# Patient Record
Sex: Female | Born: 1955 | Race: Black or African American | Hispanic: No | Smoking: Former smoker
Health system: Southern US, Community
[De-identification: ages and names within clinical notes are randomized; demographics above are authoritative.]

## PROBLEM LIST (undated history)

## (undated) ENCOUNTER — Encounter

## (undated) ENCOUNTER — Telehealth

## (undated) ENCOUNTER — Encounter: Attending: Internal Medicine | Primary: Internal Medicine

## (undated) ENCOUNTER — Ambulatory Visit: Payer: MEDICARE

## (undated) ENCOUNTER — Telehealth: Attending: Internal Medicine | Primary: Internal Medicine

## (undated) ENCOUNTER — Ambulatory Visit

## (undated) ENCOUNTER — Encounter: Payer: MEDICARE | Attending: Internal Medicine | Primary: Internal Medicine

## (undated) ENCOUNTER — Ambulatory Visit: Payer: Medicare (Managed Care)

## (undated) ENCOUNTER — Telehealth: Attending: Federally Qualified Health Center (FQHC) | Primary: Federally Qualified Health Center (FQHC)

## (undated) ENCOUNTER — Ambulatory Visit: Payer: MEDICARE | Attending: Internal Medicine | Primary: Internal Medicine

## (undated) ENCOUNTER — Telehealth: Attending: Surgery | Primary: Surgery

## (undated) ENCOUNTER — Telehealth
Attending: Pharmacist Clinician (PhC)/ Clinical Pharmacy Specialist | Primary: Pharmacist Clinician (PhC)/ Clinical Pharmacy Specialist

## (undated) DIAGNOSIS — D494 Neoplasm of unspecified behavior of bladder: Secondary | ICD-10-CM

## (undated) DIAGNOSIS — Z5189 Encounter for other specified aftercare: Secondary | ICD-10-CM

## (undated) DIAGNOSIS — N3289 Other specified disorders of bladder: Secondary | ICD-10-CM

## (undated) DIAGNOSIS — I10 Essential (primary) hypertension: Secondary | ICD-10-CM

## (undated) DIAGNOSIS — R310 Gross hematuria: Secondary | ICD-10-CM

## (undated) DIAGNOSIS — Z923 Personal history of irradiation: Secondary | ICD-10-CM

## (undated) DIAGNOSIS — Z973 Presence of spectacles and contact lenses: Secondary | ICD-10-CM

## (undated) DIAGNOSIS — Z87411 Personal history of vaginal dysplasia: Secondary | ICD-10-CM

## (undated) DIAGNOSIS — Z86018 Personal history of other benign neoplasm: Secondary | ICD-10-CM

## (undated) HISTORY — PX: COLPOSCOPY: SHX161

---
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
## 1997-05-21 ENCOUNTER — Other Ambulatory Visit: Admission: RE | Admit: 1997-05-21 | Discharge: 1997-05-21 | Payer: Self-pay | Admitting: Obstetrics & Gynecology

## 1997-11-02 ENCOUNTER — Inpatient Hospital Stay (HOSPITAL_COMMUNITY): Admission: RE | Admit: 1997-11-02 | Discharge: 1997-11-04 | Payer: Self-pay | Admitting: Obstetrics & Gynecology

## 1998-04-03 HISTORY — PX: ABDOMINAL HYSTERECTOMY: SHX81

## 2001-04-08 ENCOUNTER — Other Ambulatory Visit: Admission: RE | Admit: 2001-04-08 | Discharge: 2001-04-08 | Payer: Self-pay | Admitting: *Deleted

## 2001-07-10 ENCOUNTER — Ambulatory Visit (HOSPITAL_BASED_OUTPATIENT_CLINIC_OR_DEPARTMENT_OTHER): Admission: RE | Admit: 2001-07-10 | Discharge: 2001-07-10 | Payer: Self-pay

## 2001-08-14 ENCOUNTER — Ambulatory Visit (HOSPITAL_COMMUNITY): Admission: RE | Admit: 2001-08-14 | Discharge: 2001-08-14 | Payer: Self-pay | Admitting: Family Medicine

## 2001-08-14 ENCOUNTER — Encounter: Payer: Self-pay | Admitting: Family Medicine

## 2008-05-04 ENCOUNTER — Ambulatory Visit: Payer: Self-pay | Admitting: Gynecologic Oncology

## 2008-05-12 ENCOUNTER — Ambulatory Visit: Payer: Self-pay | Admitting: Gynecologic Oncology

## 2008-06-01 ENCOUNTER — Ambulatory Visit: Payer: Self-pay | Admitting: Gynecologic Oncology

## 2008-06-08 ENCOUNTER — Ambulatory Visit: Payer: Self-pay | Admitting: Gynecologic Oncology

## 2008-11-01 ENCOUNTER — Ambulatory Visit: Payer: Self-pay | Admitting: Gynecologic Oncology

## 2008-11-09 ENCOUNTER — Ambulatory Visit: Payer: Self-pay | Admitting: Gynecologic Oncology

## 2009-04-03 ENCOUNTER — Ambulatory Visit: Payer: Self-pay | Admitting: Gynecologic Oncology

## 2009-04-27 ENCOUNTER — Ambulatory Visit: Payer: Self-pay | Admitting: Gynecologic Oncology

## 2009-05-04 ENCOUNTER — Ambulatory Visit: Payer: Self-pay | Admitting: Gynecologic Oncology

## 2009-05-11 ENCOUNTER — Ambulatory Visit: Payer: Self-pay | Admitting: Gynecologic Oncology

## 2009-06-01 ENCOUNTER — Ambulatory Visit: Payer: Self-pay | Admitting: Gynecologic Oncology

## 2009-06-08 ENCOUNTER — Ambulatory Visit: Payer: Self-pay | Admitting: Gynecologic Oncology

## 2009-07-02 ENCOUNTER — Ambulatory Visit: Payer: Self-pay | Admitting: Gynecologic Oncology

## 2009-07-27 ENCOUNTER — Ambulatory Visit: Payer: Self-pay | Admitting: Gynecologic Oncology

## 2009-08-01 ENCOUNTER — Ambulatory Visit: Payer: Self-pay | Admitting: Gynecologic Oncology

## 2009-08-10 ENCOUNTER — Ambulatory Visit: Payer: Self-pay | Admitting: Gynecologic Oncology

## 2009-09-01 ENCOUNTER — Ambulatory Visit: Payer: Self-pay | Admitting: Gynecologic Oncology

## 2009-09-14 ENCOUNTER — Ambulatory Visit: Payer: Self-pay | Admitting: Gynecologic Oncology

## 2009-10-01 ENCOUNTER — Ambulatory Visit: Payer: Self-pay | Admitting: Gynecologic Oncology

## 2009-11-01 ENCOUNTER — Ambulatory Visit: Payer: Self-pay | Admitting: Gynecologic Oncology

## 2009-11-23 ENCOUNTER — Ambulatory Visit: Payer: Self-pay | Admitting: Gynecologic Oncology

## 2010-01-25 ENCOUNTER — Ambulatory Visit: Payer: Self-pay | Admitting: Gynecologic Oncology

## 2010-04-26 ENCOUNTER — Ambulatory Visit: Payer: Self-pay | Admitting: Gynecologic Oncology

## 2010-05-04 ENCOUNTER — Ambulatory Visit: Payer: Self-pay | Admitting: Gynecologic Oncology

## 2010-07-26 ENCOUNTER — Ambulatory Visit: Payer: Self-pay | Admitting: Gynecologic Oncology

## 2010-08-01 LAB — PATHOLOGY REPORT

## 2010-08-02 ENCOUNTER — Ambulatory Visit: Payer: Self-pay | Admitting: Gynecologic Oncology

## 2010-11-08 ENCOUNTER — Ambulatory Visit: Payer: Self-pay | Admitting: Gynecologic Oncology

## 2010-12-03 ENCOUNTER — Ambulatory Visit: Payer: Self-pay | Admitting: Gynecologic Oncology

## 2011-04-11 ENCOUNTER — Ambulatory Visit: Payer: Self-pay | Admitting: Gynecologic Oncology

## 2011-05-05 ENCOUNTER — Ambulatory Visit: Payer: Self-pay | Admitting: Gynecologic Oncology

## 2011-07-25 ENCOUNTER — Ambulatory Visit: Payer: Self-pay | Admitting: Gynecologic Oncology

## 2011-08-02 ENCOUNTER — Ambulatory Visit: Payer: Self-pay | Admitting: Gynecologic Oncology

## 2012-04-03 ENCOUNTER — Ambulatory Visit: Payer: Self-pay | Admitting: Gynecologic Oncology

## 2012-05-04 ENCOUNTER — Ambulatory Visit: Payer: Self-pay | Admitting: Gynecologic Oncology

## 2012-05-06 LAB — PATHOLOGY REPORT

## 2012-10-23 DIAGNOSIS — M545 Low back pain, unspecified: Secondary | ICD-10-CM | POA: Insufficient documentation

## 2013-06-06 ENCOUNTER — Emergency Department (HOSPITAL_BASED_OUTPATIENT_CLINIC_OR_DEPARTMENT_OTHER)
Admission: EM | Admit: 2013-06-06 | Discharge: 2013-06-06 | Disposition: A | Discharge status: Home / Self Care | Payer: BC Managed Care – PPO | Attending: Emergency Medicine | Admitting: Emergency Medicine

## 2013-06-06 ENCOUNTER — Encounter (HOSPITAL_BASED_OUTPATIENT_CLINIC_OR_DEPARTMENT_OTHER): Payer: Self-pay | Admitting: Emergency Medicine

## 2013-06-06 ENCOUNTER — Emergency Department (HOSPITAL_BASED_OUTPATIENT_CLINIC_OR_DEPARTMENT_OTHER): Payer: BC Managed Care – PPO

## 2013-06-06 DIAGNOSIS — Z87891 Personal history of nicotine dependence: Secondary | ICD-10-CM | POA: Insufficient documentation

## 2013-06-06 DIAGNOSIS — R310 Gross hematuria: Secondary | ICD-10-CM | POA: Insufficient documentation

## 2013-06-06 DIAGNOSIS — I10 Essential (primary) hypertension: Secondary | ICD-10-CM | POA: Insufficient documentation

## 2013-06-06 DIAGNOSIS — R350 Frequency of micturition: Secondary | ICD-10-CM | POA: Insufficient documentation

## 2013-06-06 DIAGNOSIS — R109 Unspecified abdominal pain: Secondary | ICD-10-CM | POA: Insufficient documentation

## 2013-06-06 DIAGNOSIS — N3289 Other specified disorders of bladder: Secondary | ICD-10-CM | POA: Insufficient documentation

## 2013-06-06 DIAGNOSIS — M549 Dorsalgia, unspecified: Secondary | ICD-10-CM | POA: Insufficient documentation

## 2013-06-06 DIAGNOSIS — Z8742 Personal history of other diseases of the female genital tract: Secondary | ICD-10-CM | POA: Insufficient documentation

## 2013-06-06 DIAGNOSIS — Z79899 Other long term (current) drug therapy: Secondary | ICD-10-CM | POA: Insufficient documentation

## 2013-06-06 DIAGNOSIS — Z87440 Personal history of urinary (tract) infections: Secondary | ICD-10-CM | POA: Insufficient documentation

## 2013-06-06 DIAGNOSIS — Z9889 Other specified postprocedural states: Secondary | ICD-10-CM | POA: Insufficient documentation

## 2013-06-06 HISTORY — DX: Essential (primary) hypertension: I10

## 2013-06-06 LAB — URINALYSIS, ROUTINE W REFLEX MICROSCOPIC
Bilirubin Urine: NEGATIVE
Glucose, UA: NEGATIVE mg/dL
Ketones, ur: NEGATIVE mg/dL
Nitrite: NEGATIVE
Protein, ur: NEGATIVE mg/dL
Specific Gravity, Urine: 1.01 (ref 1.005–1.030)
Urobilinogen, UA: 0.2 mg/dL (ref 0.0–1.0)
pH: 7.5 (ref 5.0–8.0)

## 2013-06-06 LAB — URINE MICROSCOPIC-ADD ON

## 2013-06-06 MED ORDER — TRAMADOL HCL 50 MG PO TABS: 50.0000 mg | ORAL_TABLET | Freq: Four times a day (QID) | ORAL | Status: DC | PRN

## 2013-06-06 NOTE — ED Provider Notes (Signed)
Patient seen/examined in the Emergency Department in conjunction with Resident Physician [facility] Patient reports suprapubic pain/right flank pain Exam : awake/alert, suprapubic tenderness Plan: CT imaging.  If negative stable for d/c home   Sharyon Cable, MD 06/06/13 1209

## 2013-06-06 NOTE — ED Notes (Signed)
C/o hematuria x 3 weeks-was seen by Urologist for a pre-op visit approx 1 month-was cathed-c/o pain and blood in urine since then-was then treated for UTI-states she does not want to f/u back up with Urologist

## 2013-06-06 NOTE — ED Provider Notes (Signed)
I have personally seen and examined the patient.  I have discussed the plan of care with the resident.  I have reviewed the documentation on PMH/FH/Soc. History.  I have reviewed the documentation of the resident and agree.   Sharyon Cable, MD 06/06/13 [phone]

## 2013-06-06 NOTE — ED Provider Notes (Signed)
D/w dr Louis Meckel urology via [place] nurse He is aware of CT findings F/u in clinic next week Phone numbers for patient given to physician   Sharyon Cable, MD 06/06/13 1324
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
## 2013-06-06 NOTE — Discharge Instructions (Signed)
Bladder Cancer Bladder cancer is an abnormal growth of tissue in your bladder. Your bladder is the balloon-like sac in your pelvis. It collects and stores urine that comes from the kidneys through the ureters. The bladder wall is made of layers. If cancer spreads into these layers and through the wall of the bladder, it becomes more difficult to treat.  There are four stages of bladder cancer:  Stage I. Cancer at this stage occurs in the bladder's inner lining but has not invaded the muscular bladder wall.  Stage II. At this stage, cancer has invaded the bladder wall but is still confined to the bladder.  Stage III. By this stage, the cancer cells have spread through the bladder wall to surrounding tissue. They may also have spread to the prostate in men or the uterus or vagina in women.  Stage IV. By this stage, cancer cells may have spread to the lymph nodes and other organs, such as your lungs, bones, or liver. RISK FACTORS Although the cause of bladder cancer is not known, the following risk factors can increase your chances of getting bladder cancer:   Smoking.   Occupational exposures, such as rubber, leather, textile, dyes, chemicals, and paint.   Being white.   Age.   Being female.   Having chronic bladder inflammation.   Having a bladder cancer history.   Having a family history of bladder cancer (heredity).   Having had chemotherapy or radiation therapy to the pelvis.   Being exposed to arsenic.  SYMPTOMS   Blood in the urine.   Pain with urination.   Frequent bladder or urine infections.  Increase in urgency and frequency of urination. DIAGNOSIS  Your health care provider may suspect bladder cancer based on your description of urinary symptoms or based on the finding of blood or infection in the urine (especially if this has recurred several times). Other tests or procedures that may be performed include:   A narrow tube being inserted into your  bladder through your urethra (cystoscopy) in order to view the lining of your bladder for tumors.   A biopsy to sample the tumor to see if cancer is present.  If cancer is present, it will then be staged to determine its severity and extent. It is important to know how deeply into the bladder wall the cancer has grown and whether the cancer has spread to any other parts of your body. Staging may require blood tests or special scans such as a CT scan, MRI, bone scan, or chest X-ray.  TREATMENT  Once your cancer has been diagnosed and staged, you should discuss a treatment plan with your health care provider. Based on the stage of the cancer, one treatment or a combination of treatments may be recommended. The most common forms of treatment are:   Surgery. Procedures that may be done include transurethral resection and cystectomy.  Radiation therapy. This is infrequently used to treat bladder cancer.   Chemotherapy. During this treatment, drugs are used to kill cancer cells.  Immunotherapy. This is usually administered directly into the bladder. HOME CARE INSTRUCTIONS  Only take over-the-counter or prescription medicines for pain, discomfort, or fever as directed by your health care provider.   Maintain a healthy diet.   Consider joining a support group. This may help you learn to cope with the stress of having bladder cancer.   Seek advice to help you manage treatment side effects.   Keep all follow-up appointments as directed by your health care  provider.   Inform your cancer specialist if you are admitted to the hospital.  [place] IF:  There is blood in your urine.  You have symptoms of a urinary tract infection. These include:  Tiredness.  Shakiness.  Weakness.  Muscle aches.  Abdominal pain.  Frequent and intense urge to urinate (in young women).  Burning feeling in the bladder or urethra during urination (in young women). SEEK IMMEDIATE MEDICAL  CARE IF:  You are unable to urinate. Document Released: 03/23/2003 Document Revised: 11/20/2012 Document Reviewed: 09/10/2012 [facility] Patient Information 2014 [place].

## 2013-06-07 LAB — URINE CULTURE
Colony Count: NO GROWTH
Culture: NO GROWTH

## 2013-06-09 ENCOUNTER — Other Ambulatory Visit: Payer: Self-pay | Admitting: Urology

## 2013-06-10 ENCOUNTER — Encounter (HOSPITAL_BASED_OUTPATIENT_CLINIC_OR_DEPARTMENT_OTHER): Payer: Self-pay | Admitting: *Deleted

## 2013-06-10 NOTE — Progress Notes (Signed)
NPO AFTER MN. ARRIVE AT 0900. NEEDS ISTAT AND EKG. WILL TAKE LOSARTAN AND IF NEEDED TAKE TRAMADOL AM DOS W/ SIPS OF WATER.

## 2013-06-13 ENCOUNTER — Encounter (HOSPITAL_BASED_OUTPATIENT_CLINIC_OR_DEPARTMENT_OTHER)
Admission: RE | Disposition: A | Discharge status: Home / Self Care | Payer: Self-pay | Source: Ambulatory Visit | Attending: Urology

## 2013-06-13 ENCOUNTER — Ambulatory Visit (HOSPITAL_BASED_OUTPATIENT_CLINIC_OR_DEPARTMENT_OTHER)
Admission: RE | Admit: 2013-06-13 | Discharge: 2013-06-13 | Disposition: A | Discharge status: Home / Self Care | Payer: BC Managed Care – PPO | Source: Ambulatory Visit | Attending: Urology | Admitting: Urology

## 2013-06-13 ENCOUNTER — Encounter (HOSPITAL_BASED_OUTPATIENT_CLINIC_OR_DEPARTMENT_OTHER): Payer: Self-pay | Admitting: Anesthesiology

## 2013-06-13 ENCOUNTER — Encounter (HOSPITAL_BASED_OUTPATIENT_CLINIC_OR_DEPARTMENT_OTHER): Payer: BC Managed Care – PPO | Admitting: Anesthesiology

## 2013-06-13 ENCOUNTER — Ambulatory Visit (HOSPITAL_BASED_OUTPATIENT_CLINIC_OR_DEPARTMENT_OTHER): Payer: BC Managed Care – PPO | Admitting: Anesthesiology

## 2013-06-13 DIAGNOSIS — Z79899 Other long term (current) drug therapy: Secondary | ICD-10-CM | POA: Insufficient documentation

## 2013-06-13 DIAGNOSIS — Z87891 Personal history of nicotine dependence: Secondary | ICD-10-CM | POA: Insufficient documentation

## 2013-06-13 DIAGNOSIS — C679 Malignant neoplasm of bladder, unspecified: Secondary | ICD-10-CM | POA: Insufficient documentation

## 2013-06-13 DIAGNOSIS — Z90710 Acquired absence of both cervix and uterus: Secondary | ICD-10-CM | POA: Insufficient documentation

## 2013-06-13 DIAGNOSIS — I10 Essential (primary) hypertension: Secondary | ICD-10-CM | POA: Insufficient documentation

## 2013-06-13 DIAGNOSIS — Z87440 Personal history of urinary (tract) infections: Secondary | ICD-10-CM | POA: Insufficient documentation

## 2013-06-13 DIAGNOSIS — N3289 Other specified disorders of bladder: Secondary | ICD-10-CM

## 2013-06-13 HISTORY — DX: Personal history of other benign neoplasm: Z86.018

## 2013-06-13 HISTORY — DX: Other specified disorders of bladder: N32.89

## 2013-06-13 HISTORY — PX: TRANSURETHRAL RESECTION OF BLADDER TUMOR WITH MITOMYCIN-C: SHX6459

## 2013-06-13 HISTORY — PX: CYSTOSCOPY W/ RETROGRADES: SHX1426

## 2013-06-13 HISTORY — DX: Presence of spectacles and contact lenses: Z97.3

## 2013-06-13 HISTORY — DX: Neoplasm of unspecified behavior of bladder: D49.4

## 2013-06-13 HISTORY — DX: Gross hematuria: R31.0

## 2013-06-13 LAB — POCT I-STAT 4, (NA,K, GLUC, HGB,HCT)
Glucose, Bld: 101 mg/dL — ABNORMAL HIGH (ref 70–99)
HEMATOCRIT: 44 % (ref 36.0–46.0)
Hemoglobin: 15 g/dL (ref 12.0–15.0)
Potassium: 3.6 mEq/L — ABNORMAL LOW (ref 3.7–5.3)
Sodium: 144 mEq/L (ref 137–147)

## 2013-06-13 SURGERY — TRANSURETHRAL RESECTION OF BLADDER TUMOR WITH MITOMYCIN-C
Anesthesia: General | Site: Bladder

## 2013-06-13 MED ORDER — LIDOCAINE HCL (CARDIAC) 20 MG/ML IV SOLN
INTRAVENOUS | Status: DC | PRN
  Administered 2013-06-13: 60 mg via INTRAVENOUS

## 2013-06-13 MED ORDER — FENTANYL CITRATE 0.05 MG/ML IJ SOLN
INTRAMUSCULAR | Status: AC
  Filled 2013-06-13: qty 4

## 2013-06-13 MED ORDER — FENTANYL CITRATE 0.05 MG/ML IJ SOLN
INTRAMUSCULAR | Status: AC
Start: 2013-06-13 — End: 2013-06-13
  Filled 2013-06-13: qty 2

## 2013-06-13 MED ORDER — KETOROLAC TROMETHAMINE 30 MG/ML IJ SOLN
INTRAMUSCULAR | Status: DC | PRN
  Administered 2013-06-13: 30 mg via INTRAVENOUS

## 2013-06-13 MED ORDER — LACTATED RINGERS IV SOLN
INTRAVENOUS | Status: DC
  Administered 2013-06-13 (×2): via INTRAVENOUS
  Filled 2013-06-13: qty 1000

## 2013-06-13 MED ORDER — PHENAZOPYRIDINE HCL 200 MG PO TABS
200.0000 mg | ORAL_TABLET | Freq: Three times a day (TID) | ORAL | Status: DC | PRN
  Administered 2013-06-13: 200 mg via ORAL
  Filled 2013-06-13 (×3): qty 1

## 2013-06-13 MED ORDER — SODIUM CHLORIDE 0.9 % IR SOLN
Status: DC | PRN
  Administered 2013-06-13: 20000 mL via INTRAVESICAL

## 2013-06-13 MED ORDER — PHENAZOPYRIDINE HCL 200 MG PO TABS: 200.0000 mg | ORAL_TABLET | Freq: Three times a day (TID) | ORAL | Status: DC | PRN

## 2013-06-13 MED ORDER — CIPROFLOXACIN IN D5W 400 MG/200ML IV SOLN
INTRAVENOUS | Status: AC
  Filled 2013-06-13: qty 200

## 2013-06-13 MED ORDER — LIDOCAINE HCL 2 % EX GEL
CUTANEOUS | Status: DC | PRN
  Administered 2013-06-13: 1 via URETHRAL

## 2013-06-13 MED ORDER — ACETAMINOPHEN-CODEINE #3 300-30 MG PO TABS
ORAL_TABLET | ORAL | Status: AC
  Filled 2013-06-13: qty 1

## 2013-06-13 MED ORDER — ACETAMINOPHEN-CODEINE #3 300-30 MG PO TABS
1.0000 | ORAL_TABLET | ORAL | Status: DC | PRN
  Administered 2013-06-13: 1 via ORAL
  Filled 2013-06-13: qty 1

## 2013-06-13 MED ORDER — DEXAMETHASONE SODIUM PHOSPHATE 4 MG/ML IJ SOLN
INTRAMUSCULAR | Status: DC | PRN
  Administered 2013-06-13: 10 mg via INTRAVENOUS

## 2013-06-13 MED ORDER — BELLADONNA ALKALOIDS-OPIUM 16.2-60 MG RE SUPP
RECTAL | Status: AC
  Filled 2013-06-13: qty 1

## 2013-06-13 MED ORDER — PHENAZOPYRIDINE HCL 100 MG PO TABS
ORAL_TABLET | ORAL | Status: AC
  Filled 2013-06-13: qty 2

## 2013-06-13 MED ORDER — ACETAMINOPHEN-CODEINE #3 300-30 MG PO TABS: 1.0000 | ORAL_TABLET | ORAL | Status: DC | PRN

## 2013-06-13 MED ORDER — FENTANYL CITRATE 0.05 MG/ML IJ SOLN
25.0000 ug | INTRAMUSCULAR | Status: DC | PRN
  Filled 2013-06-13: qty 1

## 2013-06-13 MED ORDER — MIDAZOLAM HCL 2 MG/2ML IJ SOLN
INTRAMUSCULAR | Status: AC
  Filled 2013-06-13: qty 2

## 2013-06-13 MED ORDER — TROSPIUM CHLORIDE ER 60 MG PO CP24: 60.0000 mg | ORAL_CAPSULE | Freq: Every day | ORAL | Status: DC

## 2013-06-13 MED ORDER — LACTATED RINGERS IV SOLN
INTRAVENOUS | Status: DC
  Administered 2013-06-13: 14:00:00 via INTRAVENOUS
  Filled 2013-06-13: qty 1000

## 2013-06-13 MED ORDER — PROPOFOL 10 MG/ML IV BOLUS
INTRAVENOUS | Status: DC | PRN
  Administered 2013-06-13: 150 mg via INTRAVENOUS

## 2013-06-13 MED ORDER — ACETAMINOPHEN 10 MG/ML IV SOLN
INTRAVENOUS | Status: DC | PRN
  Administered 2013-06-13: 1000 mg via INTRAVENOUS

## 2013-06-13 MED ORDER — EPHEDRINE SULFATE 50 MG/ML IJ SOLN
INTRAMUSCULAR | Status: DC | PRN
  Administered 2013-06-13 (×2): 10 mg via INTRAVENOUS

## 2013-06-13 MED ORDER — BELLADONNA ALKALOIDS-OPIUM 16.2-60 MG RE SUPP
RECTAL | Status: DC | PRN
  Administered 2013-06-13: 1 via RECTAL

## 2013-06-13 MED ORDER — MIDAZOLAM HCL 5 MG/5ML IJ SOLN
INTRAMUSCULAR | Status: DC | PRN
  Administered 2013-06-13: 2 mg via INTRAVENOUS

## 2013-06-13 MED ORDER — FENTANYL CITRATE 0.05 MG/ML IJ SOLN
INTRAMUSCULAR | Status: DC | PRN
  Administered 2013-06-13 (×2): 25 ug via INTRAVENOUS
  Administered 2013-06-13 (×2): 50 ug via INTRAVENOUS

## 2013-06-13 MED ORDER — ONDANSETRON HCL 4 MG/2ML IJ SOLN
INTRAMUSCULAR | Status: DC | PRN
  Administered 2013-06-13: 4 mg via INTRAVENOUS

## 2013-06-13 MED ORDER — CIPROFLOXACIN IN D5W 400 MG/200ML IV SOLN
400.0000 mg | INTRAVENOUS | Status: AC
  Administered 2013-06-13: 400 mg via INTRAVENOUS
  Filled 2013-06-13: qty 200

## 2013-06-13 MED ORDER — IOHEXOL 350 MG/ML SOLN
INTRAVENOUS | Status: DC | PRN
  Administered 2013-06-13: 19 mL

## 2013-06-13 MED ORDER — FENTANYL CITRATE 0.05 MG/ML IJ SOLN
50.0000 ug | INTRAMUSCULAR | Status: DC | PRN
  Administered 2013-06-13 (×2): 50 ug via INTRAVENOUS
  Filled 2013-06-13: qty 1

## 2013-06-13 SURGICAL SUPPLY — 48 items
ADAPTER CATH URET PLST 4-6FR (CATHETERS) IMPLANT
BAG DRAIN URO-CYSTO SKYTR STRL (DRAIN) ×2 IMPLANT
BAG URINE DRAINAGE (UROLOGICAL SUPPLIES) ×2 IMPLANT
BAG URO CATCHER STRL LF (DRAPE) ×2 IMPLANT
BASKET LASER NITINOL 1.9FR (BASKET) IMPLANT
BASKET STNLS GEMINI 4WIRE 3FR (BASKET) IMPLANT
BASKET ZERO TIP NITINOL 2.4FR (BASKET) IMPLANT
BLADE SURG 15 STRL LF DISP TIS (BLADE) IMPLANT
BLADE SURG 15 STRL SS (BLADE)
CANISTER SUCT LVC 12 LTR MEDI- (MISCELLANEOUS) ×6 IMPLANT
CATH FOLEY 3WAY 20FR (CATHETERS) ×2 IMPLANT
CATH FOLEY 3WAY 30CC 22FR (CATHETERS) ×2 IMPLANT
CATH INTERMIT  6FR 70CM (CATHETERS) IMPLANT
CATH URET 5FR 28IN CONE TIP (BALLOONS)
CATH URET 5FR 28IN OPEN ENDED (CATHETERS) ×2 IMPLANT
CATH URET 5FR 70CM CONE TIP (BALLOONS) IMPLANT
CATH URET DUAL LUMEN 6-10FR 50 (CATHETERS) IMPLANT
CLOTH BEACON ORANGE TIMEOUT ST (SAFETY) ×2 IMPLANT
DRAPE CAMERA CLOSED 9X96 (DRAPES) ×2 IMPLANT
DRSG TEGADERM 2-3/8X2-3/4 SM (GAUZE/BANDAGES/DRESSINGS) IMPLANT
ELECT REM PT RETURN 9FT ADLT (ELECTROSURGICAL) ×2
ELECTRODE REM PT RTRN 9FT ADLT (ELECTROSURGICAL) ×1 IMPLANT
EVACUATOR MICROVAS BLADDER (UROLOGICAL SUPPLIES) ×2 IMPLANT
FIBER LASER FLEXIVA 200 (UROLOGICAL SUPPLIES) IMPLANT
FIBER LASER FLEXIVA 365 (UROLOGICAL SUPPLIES) IMPLANT
GLOVE BIO SURGEON STRL SZ7.5 (GLOVE) ×2 IMPLANT
GLOVE BIO SURGEON STRL SZ8 (GLOVE) ×2 IMPLANT
GOWN PREVENTION PLUS XLARGE (GOWN DISPOSABLE) ×2 IMPLANT
GOWN STRL REIN XL XLG (GOWN DISPOSABLE) ×2 IMPLANT
GUIDEWIRE 0.038 PTFE COATED (WIRE) IMPLANT
GUIDEWIRE ANG ZIPWIRE 038X150 (WIRE) IMPLANT
GUIDEWIRE STR DUAL SENSOR (WIRE) ×2 IMPLANT
HOLDER FOLEY CATH W/STRAP (MISCELLANEOUS) IMPLANT
IV NS 1000ML (IV SOLUTION) ×16
IV NS 1000ML BAXH (IV SOLUTION) ×16 IMPLANT
IV NS IRRIG 3000ML ARTHROMATIC (IV SOLUTION) ×4 IMPLANT
KIT BALLIN UROMAX 15FX10 (LABEL) IMPLANT
KIT BALLN UROMAX 15FX4 (MISCELLANEOUS) IMPLANT
KIT BALLN UROMAX 26 75X4 (MISCELLANEOUS)
NS IRRIG 500ML POUR BTL (IV SOLUTION) IMPLANT
PACK CYSTOSCOPY (CUSTOM PROCEDURE TRAY) ×2 IMPLANT
PLUG CATH AND CAP STER (CATHETERS) ×2 IMPLANT
SET HIGH PRES BAL DIL (LABEL)
SHEATH ACCESS URETERAL 38CM (SHEATH) IMPLANT
SHEATH ACCESS URETERAL 54CM (SHEATH) IMPLANT
SUT ETHILON 3 0 PS 1 (SUTURE) IMPLANT
SYR 30ML LL (SYRINGE) IMPLANT
TUBE FEEDING 8FR 16IN STR KANG (MISCELLANEOUS) IMPLANT

## 2013-06-13 NOTE — Anesthesia Procedure Notes (Signed)
Procedure Name: LMA Insertion Date/Time: 06/13/2013 10:47 AM Performed by: Mechele Claude Pre-anesthesia Checklist: Patient identified, Emergency Drugs available, Suction available and Patient being monitored Patient Re-evaluated:Patient Re-evaluated prior to inductionOxygen Delivery Method: Circle System Utilized Preoxygenation: Pre-oxygenation with 100% oxygen Intubation Type: IV induction Ventilation: Mask ventilation without difficulty LMA: LMA inserted LMA Size: 4.0 Number of attempts: 1 Airway Equipment and Method: bite block Placement Confirmation: positive ETCO2 Tube secured with: Tape Dental Injury: Teeth and Oropharynx as per pre-operative assessment

## 2013-06-13 NOTE — Anesthesia Postprocedure Evaluation (Signed)
  Anesthesia Post-op Note  Patient: Ruth Maldonado  Procedure(s) Performed: Procedure(s) (LRB): CYSTOSCOPY TRANSURETHERAL RESECTION BLADDER TUMOR BILATERAL  RETROGRADE PYLEOGRAM INSULATION OF MITOMYCON C (N/A) CYSTOSCOPY WITH RETROGRADE PYELOGRAM (N/A)  Patient Location: PACU  Anesthesia Type: General  Level of Consciousness: awake and alert   Airway and Oxygen Therapy: Patient Spontanous Breathing  Post-op Pain: mild  Post-op Assessment: Post-op Vital signs reviewed, Patient's Cardiovascular Status Stable, Respiratory Function Stable, Patent Airway and No signs of Nausea or vomiting  Last Vitals:  Filed Vitals:   06/13/13 1315  BP: 152/81  Pulse: 59  Temp:   Resp: 10    Post-op Vital Signs: stable   Complications: No apparent anesthesia complications

## 2013-06-13 NOTE — Op Note (Addendum)
Preoperative diagnosis:  1. Right posterior bladder lesion   Postoperative diagnosis:  1. 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   Procedure: 1. Cystoscopy 2. Bilateral retrograde pyelograms with interpretation 3. TURBT 2-5 cm  Surgeon: Ardis Hughs, MD  Anesthesia: General  Complications: None  Intraoperative findings: The retrograde pyelograms were performed bilaterally and revealed a normal caliber ureter bilaterally with no filling defects in the collecting systems bilaterally. The tumor had a broad base, appeared to be high-grade. 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 There was a small area of the resection perivesical fat was visible, and as such mitomycin-C was not given in the postoperative period.  EBL: Minimal  Specimens: None  Indication: Ruth Maldonado is a 58 y.o. patient with gross hematuria. Workup with CT revealed a bladder lesion, and this was confirmed by cystoscopy.  After reviewing the management options for treatment, he elected to proceed with the above surgical procedure(s). We have discussed the potential benefits and risks of the procedure, side effects of the proposed treatment, the likelihood of the patient achieving the goals of the procedure, and any potential problems that might occur during the procedure or recuperation. Informed consent has been obtained.  Description of procedure:  The patient was taken to the operating room and general anesthesia was induced.  The patient was placed in the dorsal lithotomy position, prepped and draped in the usual sterile fashion, and preoperative antibiotics were administered. A preoperative time-out was performed.   A 70 86 French rigid cystoscope was then gently passed into the patient's urethra and into the bladder under visual guidance. A 360 cystoscopic evaluation was performed with the above findings. The 30 was then  exchanged for the 70 lens and retrograde pyelograms were performed bilaterally in the routine fashion. The above findings were noted. The cystoscope was then removed and the 26 French resectoscope sheath was then passed into the patient's urethra gently using an operator. There was then removed and resectoscope then passed through the sheath. I then proceeded to resected the tumor and the surrounding satellite lesions. 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 These areas were all resected. I then took separate bladder base biopsies and sent these separately to pathology. Once the tumor had been completely resected and hemostasis had been achieved, the specimens will were removed from the patient's bladder and sent to pathology. An exam under anesthesia was limited because of the patient's short vagina, I was unable to palpate the patient's bladder bimanually. I then passed a 41 [place] three-way Foley catheter into the patient's bladder and clear reflux was returned. 10 cc of sterile water was inflated in the balloon. Prior to passage of the catheter a 10 cc vial of 1% viscous lidocaine was injected into the patient's ureter. A  B.&O. suppository was placed at the beginning of the case. The patient was subsequently awakened and returned to PACU in excellent condition. There were no immediate complications.  Disposition: The Foley catheter will be removed in the PACU. The patient has scheduled followup 2 weeks to discuss her pathology report.  Ardis Hughs, M.D.

## 2013-06-13 NOTE — Discharge Instructions (Signed)
Transurethral Resection of Bladder Tumor (TURBT) or Bladder Biopsy ° ° °Definition: ° Transurethral Resection of the Bladder Tumor is a surgical procedure used to diagnose and remove tumors within the bladder. TURBT is the most common treatment for early stage bladder cancer. ° °General instructions: °   ° Your recent bladder surgery requires very little post hospital care but some definite precautions. ° °Despite the fact that no skin incisions were used, the area around the bladder incisions are raw and covered with scabs to promote healing and prevent bleeding. Certain precautions are needed to insure that the scabs are not disturbed over the next 2-4 weeks while the healing proceeds. ° °Because the raw surface inside your bladder and the irritating effects of urine you may expect frequency of urination and/or urgency (a stronger desire to urinate) and perhaps even getting up at night more often. This will usually resolve or improve slowly over the healing period. You may see some blood in your urine over the first 6 weeks. Do not be alarmed, even if the urine was clear for a while. Get off your feet and drink lots of fluids until clearing occurs. If you start to pass clots or don't improve call us. ° °Diet: ° °You may return to your normal diet immediately. Because of the raw surface of your bladder, alcohol, spicy foods, foods high in acid and drinks with caffeine may cause irritation or frequency and should be used in moderation. To keep your urine flowing freely and avoid constipation, drink plenty of fluids during the day (8-10 glasses). Tip: Avoid cranberry juice because it is very acidic. ° °Activity: ° °Your physical activity doesn't need to be restricted. However, if you are very active, you may see some blood in the urine. We suggest that you reduce your activity under the circumstances until the bleeding has stopped. ° °Bowels: ° °It is important to keep your bowels regular during the postoperative  period. Straining with bowel movements can cause bleeding. A bowel movement every other day is reasonable. Use a mild laxative if needed, such as milk of magnesia 2-3 tablespoons, or 2 Dulcolax tablets. Call if you continue to have problems. If you had been taking narcotics for pain, before, during or after your surgery, you may be constipated. Take a laxative if necessary. ° ° ° °Medication: ° °You should resume your pre-surgery medications unless told not to. In addition you may be given an antibiotic to prevent or treat infection. Antibiotics are not always necessary. All medication should be taken as prescribed until the bottles are finished unless you are having an unusual reaction to one of the drugs. ° ° ° ° ° °Post Anesthesia Home Care Instructions ° °Activity: °Get plenty of rest for the remainder of the day. A responsible adult should stay with you for 24 hours following the procedure.  °For the next 24 hours, DO NOT: °-Drive a car °-Operate machinery °-Drink alcoholic beverages °-Take any medication unless instructed by your physician °-Make any legal decisions or sign important papers. ° °Meals: °Start with liquid foods such as gelatin or soup. Progress to regular foods as tolerated. Avoid greasy, spicy, heavy foods. If nausea and/or vomiting occur, drink only clear liquids until the nausea and/or vomiting subsides. Call your physician if vomiting continues. ° °Special Instructions/Symptoms: °Your throat may feel dry or sore from the anesthesia or the breathing tube placed in your throat during surgery. If this causes discomfort, gargle with warm salt water. The discomfort should disappear within 24   hours.     

## 2013-06-13 NOTE — H&P (Signed)
Reason For Visit bladder mass   History of Present Illness Patient is referred by the [facility] where she was found to have a bladder mass. Patient presented there last week with three weeks of gross hematuria. She had been seen by her GYN and scheduled for leep procedure due to an abnormal PAP, but was referred to Urology for hematuria eval prior. She saw a urologist in [facility] and was given abx to treat a UTI. She then was seen at Urgent Care for ongoing hematuria, where a CT scan was performed.   Past Medical History Problems  1. History of hypertension (V12.59) 2. History of uterine leiomyoma (V13.29)  Surgical History Problems  1. History of Hysterectomy  Current Meds 1. HydrALAZINE HCl TABS;  Therapy: (Recorded:09Mar2015) to Recorded 2. Losartan Potassium TABS;  Therapy: (Recorded:09Mar2015) to Recorded 3. TraMADol HCl - 50 MG Oral Tablet;  Therapy: (Recorded:09Mar2015) to Recorded  Allergies Medication  1. No Known Drug Allergies Non-Medication  2. Adhesive Tape  Family History Problems  1. Family history of chronic obstructive pulmonary disease (V17.6) : Mother 2. Family history of myocardial infarction (V17.3) : Father  Social History Problems    Denied: History of Alcohol use   Former smoker [place])   Married  Review of Systems Genitourinary, constitutional, skin, eye, otolaryngeal, hematologic/lymphatic, cardiovascular, pulmonary, endocrine, musculoskeletal, gastrointestinal, neurological and psychiatric system(s) were reviewed and pertinent findings if present are noted.  Genitourinary: hematuria.    Vitals Vital Signs [Data Includes: Last 1 Day]  Recorded: [id number] 11:10AM  Height: 5 ft 5.5 in Weight: 194 lb  BMI Calculated: 31.79 BSA Calculated: 1.96 Blood Pressure: 128 / 85 Temperature: 97.4 F Heart Rate: 73  Physical Exam Constitutional: Well nourished and well developed . No acute distress.  ENT:. The ears and nose are  normal in appearance.  Neck: The appearance of the neck is normal and no neck mass is present.  Pulmonary: No respiratory distress and normal respiratory rhythm and effort.  Cardiovascular: Heart rate and rhythm are normal . No peripheral edema.  Abdomen: The abdomen is soft and nontender. No masses are palpated. No CVA tenderness. No hernias are palpable. No hepatosplenomegaly noted.  Genitourinary:  Chaperone Present: .  Examination of the external genitalia shows normal female external genitalia and no lesions. The urethra is normal in appearance and not tender. There is no urethral mass. Vaginal exam demonstrates no abnormalities. The adnexa are palpably normal. The bladder is non tender and not distended. The anus is normal on inspection. The perineum is normal on inspection.  Lymphatics: The femoral and inguinal nodes are not enlarged or tender.  Skin: Normal skin turgor, no visible rash and no visible skin lesions.  Neuro/Psych:. Mood and affect are appropriate.    Results/Data Urine [Data Includes: Last 1 Day]   [id number]  COLOR AMBER   APPEARANCE CLOUDY   SPECIFIC GRAVITY 1.010   pH 6.5   GLUCOSE NEG mg/dL  BILIRUBIN NEG   KETONE NEG mg/dL  BLOOD LARGE   PROTEIN NEG mg/dL  UROBILINOGEN 0.2 mg/dL  NITRITE NEG   LEUKOCYTE ESTERASE TRACE   SQUAMOUS EPITHELIAL/HPF FEW   WBC 3-6 WBC/hpf  RBC 11-20 RBC/hpf  BACTERIA MODERATE   CRYSTALS NONE SEEN   CASTS NONE SEEN   Other MUCUS    The following images/tracing/specimen were independently visualized: .   CT non-contrast abd/pelv:  There is a 3.4 cm lesion involving the anterior urinary bladder  wall. Findings are suggestive for a bladder neoplasm. In  addition,  there is mild stranding along the anterior aspect of the urinary  bladder and adjacent to the lesion. This finding raises concern for  neoplastic involvement beyond the urinary bladder wall.    No evidence for kidney stones or hydronephrosis.    Colonic  diverticulosis. No acute colonic inflammation.      Procedure  Procedure: Cystoscopy  Chaperone Present: Lauren.  Indication: Hematuria. Bladder Mass.  Informed Consent: from the patient . Specific risks including, but not limited to bleeding, infection, pain, allergic reaction etc. were explained.  Prep: The patient was prepped with hibiclens.  Anesthesia:. Local anesthesia was administered intraurethrally with 2% lidocaine jelly.  Antibiotic prophylaxis: Ciprofloxacin.  Procedure Note:  Urethral meatus:. No abnormalities.  Anterior urethra: No abnormalities.  Bladder: Visulization was clear. A systematic survey of the bladder demonstrated no bladder tumors or stones. A solitary tumor was visualized in the bladder. 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 This tumor was located on the right side, on the posterior aspect, near the dome of the bladder. (very small surrounding satellite lesions) Urine was sent for culture. The patient tolerated the procedure well.  Complications: None.    Assessment Assessed  1. Bladder mass (596.89)  Plan  Bladder mass  1. Follow-up Schedule Surgery Office  Follow-up  Status: Complete  Done: [id number] Health Maintenance  2. UA With REFLEX; [Do Not Release]; Status:Complete;   Done: [id number] 11:00AM  URINE CULTURE; Status:In Progress - Specimen/Data Collected;  Done: [id number] Perform:[facility]; Due:11Mar2015; Marked Important; Last Updated EQ:ASTMHD, Santiago Glad; 06/09/2013 12:16:25 PM;Ordered; Today;  QQI:WLNLGXQ mass; Ordered JJ:HERDEYC, [name] Kitchen;   Discussion/Summary Patient has a 2.5 cm papillary lesion in the posterior aspect of the right bladder wall with at least 2 small satellite lesions. We discussed the treatment options for this more specifically TURBT. We'll plan to perform bilateral retrograde pyelograms of the same time. We also discussed use of postoperative mitomycin C. I went over the risks and  benefits of the operation in detail. We'll get the patient scheduled as soon as possible.

## 2013-06-13 NOTE — Anesthesia Preprocedure Evaluation (Addendum)
Anesthesia Evaluation  Patient identified by MRN, date of birth, ID band Patient awake    Reviewed: Allergy & Precautions, H&P , NPO status , Patient's Chart, lab work & pertinent test results  Airway Mallampati: II TM Distance: >3 FB Neck ROM: Full    Dental no notable dental hx.    Pulmonary neg pulmonary ROS, former smoker,  breath sounds clear to auscultation  Pulmonary exam normal       Cardiovascular hypertension, Pt. on medications Rhythm:Regular Rate:Normal     Neuro/Psych negative neurological ROS  negative psych ROS   GI/Hepatic negative GI ROS, Neg liver ROS,   Endo/Other  negative endocrine ROS  Renal/GU negative Renal ROS  negative genitourinary   Musculoskeletal negative musculoskeletal ROS (+)   Abdominal   Peds negative pediatric ROS (+)  Hematology negative hematology ROS (+)   Anesthesia Other Findings   Reproductive/Obstetrics negative OB ROS                          Anesthesia Physical Anesthesia Plan  ASA: II  Anesthesia Plan: General   Post-op Pain Management:    Induction: Intravenous  Airway Management Planned: LMA  Additional Equipment:   Intra-op Plan:   Post-operative Plan: Extubation in OR  Informed Consent: I have reviewed the patients History and Physical, chart, labs and discussed the procedure including the risks, benefits and alternatives for the proposed anesthesia with the patient or authorized representative who has indicated his/her understanding and acceptance.   Dental advisory given  Plan Discussed with: CRNA  Anesthesia Plan Comments:         Anesthesia Quick Evaluation

## 2013-06-13 NOTE — Transfer of Care (Signed)
Immediate Anesthesia Transfer of Care Note  Patient: Ruth Maldonado  Procedure(s) Performed: Procedure(s) (LRB): CYSTOSCOPY TRANSURETHERAL RESECTION BLADDER TUMOR BILATERAL  RETROGRADE PYLEOGRAM INSULATION OF MITOMYCON C (N/A) CYSTOSCOPY WITH RETROGRADE PYELOGRAM (N/A)  Patient Location: PACU  Anesthesia Type: General  Level of Consciousness: awake, alert  and oriented  Airway & Oxygen Therapy: Patient Spontanous Breathing and Patient connected to face mask oxygen  Post-op Assessment: Report given to PACU RN and Post -op Vital signs reviewed and stable  Post vital signs: Reviewed and stable  Complications: No apparent anesthesia complications

## 2013-06-14 ENCOUNTER — Emergency Department (HOSPITAL_BASED_OUTPATIENT_CLINIC_OR_DEPARTMENT_OTHER)
Admission: EM | Admit: 2013-06-14 | Discharge: 2013-06-14 | Disposition: A | Discharge status: Home / Self Care | Payer: BC Managed Care – PPO | Attending: Emergency Medicine | Admitting: Emergency Medicine

## 2013-06-14 ENCOUNTER — Encounter (HOSPITAL_BASED_OUTPATIENT_CLINIC_OR_DEPARTMENT_OTHER): Payer: Self-pay | Admitting: Emergency Medicine

## 2013-06-14 DIAGNOSIS — R319 Hematuria, unspecified: Secondary | ICD-10-CM

## 2013-06-14 DIAGNOSIS — Z90710 Acquired absence of both cervix and uterus: Secondary | ICD-10-CM | POA: Insufficient documentation

## 2013-06-14 DIAGNOSIS — R109 Unspecified abdominal pain: Secondary | ICD-10-CM | POA: Insufficient documentation

## 2013-06-14 DIAGNOSIS — I10 Essential (primary) hypertension: Secondary | ICD-10-CM | POA: Insufficient documentation

## 2013-06-14 DIAGNOSIS — Z79899 Other long term (current) drug therapy: Secondary | ICD-10-CM | POA: Insufficient documentation

## 2013-06-14 DIAGNOSIS — Z87891 Personal history of nicotine dependence: Secondary | ICD-10-CM | POA: Insufficient documentation

## 2013-06-14 DIAGNOSIS — R300 Dysuria: Secondary | ICD-10-CM | POA: Insufficient documentation

## 2013-06-14 DIAGNOSIS — R339 Retention of urine, unspecified: Secondary | ICD-10-CM | POA: Insufficient documentation

## 2013-06-14 DIAGNOSIS — Z8742 Personal history of other diseases of the female genital tract: Secondary | ICD-10-CM | POA: Insufficient documentation

## 2013-06-14 DIAGNOSIS — Z789 Other specified health status: Secondary | ICD-10-CM | POA: Insufficient documentation

## 2013-06-14 LAB — BASIC METABOLIC PANEL
BUN: 16 mg/dL (ref 6–23)
CO2: 24 mEq/L (ref 19–32)
Calcium: 9.4 mg/dL (ref 8.4–10.5)
Chloride: 102 mEq/L (ref 96–112)
Creatinine, Ser: 0.8 mg/dL (ref 0.50–1.10)
GFR calc non Af Amer: 79 mL/min — ABNORMAL LOW (ref >90)
Glucose, Bld: 163 mg/dL — ABNORMAL HIGH (ref 70–99)
POTASSIUM: 3.6 meq/L — AB (ref 3.7–5.3)
SODIUM: 142 meq/L (ref 137–147)

## 2013-06-14 LAB — CBC WITH DIFFERENTIAL/PLATELET
BASOS ABS: 0 10*3/uL (ref 0.0–0.1)
BASOS PCT: 0 % (ref 0–1)
Eosinophils Absolute: 0 10*3/uL (ref 0.0–0.7)
Eosinophils Relative: 0 % (ref 0–5)
HCT: 39.3 % (ref 36.0–46.0)
HEMOGLOBIN: 12.6 g/dL (ref 12.0–15.0)
Lymphocytes Relative: 11 % — ABNORMAL LOW (ref 12–46)
Lymphs Abs: 1.3 10*3/uL (ref 0.7–4.0)
MCH: 25.7 pg — ABNORMAL LOW (ref 26.0–34.0)
MCHC: 32.1 g/dL (ref 30.0–36.0)
MCV: 80.2 fL (ref 78.0–100.0)
MONOS PCT: 5 % (ref 3–12)
Monocytes Absolute: 0.5 10*3/uL (ref 0.1–1.0)
NEUTROS ABS: 9.7 10*3/uL — AB (ref 1.7–7.7)
NEUTROS PCT: 84 % — AB (ref 43–77)
PLATELETS: 281 10*3/uL (ref 150–400)
RBC: 4.9 MIL/uL (ref 3.87–5.11)
RDW: 14.6 % (ref 11.5–15.5)
WBC: 11.5 10*3/uL — ABNORMAL HIGH (ref 4.0–10.5)

## 2013-06-14 LAB — URINALYSIS, ROUTINE W REFLEX MICROSCOPIC
Bilirubin Urine: NEGATIVE
Glucose, UA: NEGATIVE mg/dL
Ketones, ur: NEGATIVE mg/dL
NITRITE: POSITIVE — AB
Protein, ur: >300 mg/dL — AB
SPECIFIC GRAVITY, URINE: 1.016 (ref 1.005–1.030)
Urobilinogen, UA: 1 mg/dL (ref 0.0–1.0)
pH: 7 (ref 5.0–8.0)

## 2013-06-14 LAB — URINE MICROSCOPIC-ADD ON

## 2013-06-14 MED ORDER — CEFTRIAXONE SODIUM 1 G IJ SOLR
1.0000 g | Freq: Once | INTRAMUSCULAR | Status: AC
  Administered 2013-06-14: 1 g via INTRAMUSCULAR
  Filled 2013-06-14: qty 10

## 2013-06-14 MED ORDER — DOXYCYCLINE HYCLATE 100 MG PO CAPS: 100.0000 mg | ORAL_CAPSULE | Freq: Two times a day (BID) | ORAL | Status: DC

## 2013-06-14 MED ORDER — LIDOCAINE HCL (PF) 1 % IJ SOLN
INTRAMUSCULAR | Status: AC
  Filled 2013-06-14: qty 5

## 2013-06-14 NOTE — ED Notes (Signed)
Pt had tumor removed from bladder yesterday at [facility], now unable to void

## 2013-06-14 NOTE — ED Provider Notes (Signed)
CSN: [id number]     Arrival date & time 06/14/13  0149 History   First MD Initiated Contact with Patient 06/14/13 [phone]     Chief Complaint  Patient presents with  . Urinary Retention     (Consider location/radiation/quality/duration/timing/severity/associated sxs/prior Treatment) Patient is a 58 y.o. female presenting with hematuria. The history is provided by the patient. No language interpreter was used.  Hematuria This is a new problem. The current episode started 6 to 12 hours ago. The problem occurs constantly. The problem has not changed since onset.Associated symptoms include abdominal pain. Pertinent negatives include no chest pain, no headaches and no shortness of breath. Associated symptoms comments: Urinary retention since surgery. Nothing aggravates the symptoms. Nothing relieves the symptoms. She has tried nothing for the symptoms. The treatment provided no relief.    Past Medical History  Diagnosis Date  . Hypertension   . History of uterine fibroid   . Bladder tumor   . Gross hematuria   . Bladder spasm   . Wears contact lenses    Past Surgical History  Procedure Laterality Date  . Abdominal hysterectomy  2000  . Colposcopy     History reviewed. No pertinent family history. History  Substance Use Topics  . Smoking status: Former Smoker -- 1.00 packs/day for 24 years    Types: Cigarettes    Quit date: 06/11/1998  . Smokeless tobacco: Never Used  . Alcohol Use: No   OB History   Grav Para Term Preterm Abortions TAB SAB Ect Mult Living                 Review of Systems  Constitutional: Negative for fever.  Respiratory: Negative for shortness of breath.   Cardiovascular: Negative for chest pain.  Gastrointestinal: Positive for abdominal pain.  Genitourinary: Positive for hematuria and difficulty urinating. Negative for flank pain.  Neurological: Negative for headaches.  All other systems reviewed and are negative.      Allergies  Adhesive  Home  Medications   Current Outpatient Rx  Name  Route  Sig  Dispense  Refill  . acetaminophen-codeine (TYLENOL #3) 300-30 MG per tablet   Oral   Take 1 tablet by mouth every 4 (four) hours as needed.   20 tablet   0   . Cholecalciferol (VITAMIN D3) 1000 UNITS CAPS   Oral   Take 1 capsule by mouth daily.         [name] Kitchen doxycycline (VIBRAMYCIN) 100 MG capsule   Oral   Take 1 capsule (100 mg total) by mouth 2 (two) times daily. One po bid x 7 days   14 capsule   0   . HYDRALAZINE-HCTZ PO   Oral   Take by mouth every morning.          [name] Kitchen LOSARTAN POTASSIUM PO   Oral   Take by mouth every morning.          . milk thistle 175 MG tablet   Oral   Take 175 mg by mouth daily.         . phenazopyridine (PYRIDIUM) 200 MG tablet   Oral   Take 1 tablet (200 mg total) by mouth 3 (three) times daily as needed for pain.   10 tablet   0   . traMADol (ULTRAM) 50 MG tablet   Oral   Take 1 tablet (50 mg total) by mouth every 6 (six) hours as needed for severe pain.   30 tablet   0   .  Trospium Chloride 60 MG CP24   Oral   Take 1 capsule (60 mg total) by mouth daily.   14 each   0   . vitamin E 400 UNIT capsule   Oral   Take 400 Units by mouth daily.          BP 126/58  Pulse 78  Temp(Src) 98.6 F (37 C) (Oral)  Resp 18  SpO2 100% Physical Exam  Constitutional: She is oriented to person, place, and time. She appears well-developed and well-nourished. No distress.  HENT:  Head: Normocephalic and atraumatic.  Mouth/Throat: Oropharynx is clear and moist.  Eyes: Conjunctivae are normal. Pupils are equal, round, and reactive to light.  Neck: Normal range of motion. Neck supple.  Cardiovascular: Normal rate, regular rhythm and intact distal pulses.   Pulmonary/Chest: Effort normal and breath sounds normal.  Abdominal: Soft. Bowel sounds are normal. There is no tenderness. There is no rebound and no guarding.  Musculoskeletal: Normal range of motion.  Neurological: She is  alert and oriented to person, place, and time.  Skin: Skin is warm and dry.  Psychiatric: She has a normal mood and affect.    ED Course  Procedures (including critical care time) Labs Review Labs Reviewed  CBC WITH DIFFERENTIAL - Abnormal; Notable for the following:    WBC 11.5 (*)    MCH 25.7 (*)    Neutrophils Relative % 84 (*)    Neutro Abs 9.7 (*)    Lymphocytes Relative 11 (*)    All other components within normal limits  BASIC METABOLIC PANEL - Abnormal; Notable for the following:    Potassium 3.6 (*)    Glucose, Bld 163 (*)    GFR calc non Af Amer 79 (*)    All other components within normal limits  URINALYSIS, ROUTINE W REFLEX MICROSCOPIC - Abnormal; Notable for the following:    Color, Urine RED (*)    APPearance TURBID (*)    Hgb urine dipstick LARGE (*)    Protein, ur >300 (*)    Nitrite POSITIVE (*)    Leukocytes, UA SMALL (*)    All other components within normal limits  URINE MICROSCOPIC-ADD ON   Imaging Review No results found.   EKG Interpretation None      MDM   Final diagnoses:  Urinary retention  Hematuria    D/w Dr. Risa Grill via phone.  Please insert foley catheter to be removed in the office in 7 days  Will start antibiotics and have instructed patient to call urology for follow up    Deepak Bless Alfonso Patten, MD 06/14/13 [id number]

## 2013-06-14 NOTE — Discharge Instructions (Signed)
Hematuria, Adult Hematuria is blood in your urine. It can be caused by a bladder infection, kidney infection, prostate infection, kidney stone, or cancer of your urinary tract. Infections can usually be treated with medicine, and a kidney stone usually will pass through your urine. If neither of these is the cause of your hematuria, further workup to find out the reason may be needed. It is very important that you tell your health care provider about any blood you see in your urine, even if the blood stops without treatment or happens without causing pain. Blood in your urine that happens and then stops and then happens again can be a symptom of a very serious condition. Also, pain is not a symptom in the initial stages of many urinary cancers. HOME CARE INSTRUCTIONS   Drink lots of fluid, 3 4 quarts a day. If you have been diagnosed with an infection, cranberry juice is especially recommended, in addition to large amounts of water.  Avoid caffeine, tea, and carbonated beverages, because they tend to irritate the bladder.  Avoid alcohol because it may irritate the prostate.  Only take over-the-counter or prescription medicines for pain, discomfort, or fever as directed by your health care provider.  If you have been diagnosed with a kidney stone, follow your health care provider's instructions regarding straining your urine to catch the stone.  Empty your bladder often. Avoid holding urine for long periods of time.  After a bowel movement, women should cleanse front to back. Use each tissue only once.  Empty your bladder before and after sexual intercourse if you are a female. SEEK MEDICAL CARE IF: You develop back pain, fever, a feeling of sickness in your stomach (nausea), or vomiting or if your symptoms are not better in 3 days. Return sooner if you are getting worse. SEEK IMMEDIATE MEDICAL CARE IF:   You have a persistent fever, with a temperature of 101.70F (38.8C) or greater.  You  develop severe vomiting and are unable to keep the medicine down.  You develop severe back or abdominal pain despite taking your medicines.  You begin passing a large amount of blood or clots in your urine.  You feel extremely weak or faint, or you pass out. MAKE SURE YOU:   Understand these instructions.  Will watch your condition.  Will get help right away if you are not doing well or get worse. Document Released: 03/20/2005 Document Revised: 01/08/2013 Document Reviewed: 11/18/2012 [facility] Patient Information 2014 [place].  Acute Urinary Retention Acute urinary retention is the temporary inability to urinate. This is an uncommon problem in women. It can be caused by:  Infection.  A side effect of a medicine.  A problem in a nearby organ that presses or squeezes on the bladder or the urethra (the tube that drains the bladder).  Psychological problems.   Surgery on your bladder, urethra, or pelvic organs that causes obstruction to the outflow of urine from your bladder. HOME CARE INSTRUCTIONS  If you are sent home with a Foley catheter and a drainage system, you will need to discuss the best course of action with your health care provider. While the catheter is in, maintain a good intake of fluids. Keep the drainage bag emptied and lower than your catheter. This is so that contaminated urine will not flow back into your bladder, which could lead to a urinary tract infection. There are two main types of drainage bags. One is a large bag that usually is used at night. It  has a good capacity that will allow you to sleep through the night without having to empty it. The second type is called a leg bag. It has a smaller capacity so it needs to be emptied more frequently. However, the main advantage is that it can be attached by a leg strap and goes underneath your clothing, allowing you the freedom to move about or leave your home. Only take over-the-counter or prescription  medicines for pain, discomfort, or fever as directed by your health care provider.  SEEK MEDICAL CARE IF:  You develop a low-grade fever.  You experience spasms or leakage of urine with the spasms. SEEK IMMEDIATE MEDICAL CARE IF:   You develop chills or fever.  Your catheter stops draining urine.  Your catheter falls out.  You start to develop increased bleeding that does not respond to rest and increased fluid intake. MAKE SURE YOU:  Understand these instructions.  Will watch your condition.  Will get help right away if you are not doing well or get worse. Document Released: 03/19/2006 Document Revised: 01/08/2013 Document Reviewed: 08/29/2012 [facility] Patient Information 2014 [place].

## 2013-06-16 ENCOUNTER — Encounter (HOSPITAL_BASED_OUTPATIENT_CLINIC_OR_DEPARTMENT_OTHER): Payer: Self-pay | Admitting: Urology

## 2013-06-25 ENCOUNTER — Telehealth: Payer: Self-pay | Admitting: Oncology

## 2013-06-25 NOTE — Telephone Encounter (Signed)
LEFT MESSAGE FOR PATIENT TO RETURN CALL TO SCHEDULE NEW PATIENT APPT.  °

## 2013-06-26 ENCOUNTER — Telehealth: Payer: Self-pay | Admitting: Oncology

## 2013-06-26 NOTE — Telephone Encounter (Signed)
C/D 06/26/13 for appt. 07/02/13

## 2013-06-26 NOTE — Telephone Encounter (Signed)
S/W PATIENT AND GAVE NEW PATIENT APPT FOR 04/01 @ 10:30 W/DR. SHADAD.  [place] PACKET MAILED.

## 2013-06-27 ENCOUNTER — Other Ambulatory Visit: Payer: Self-pay | Admitting: Oncology

## 2013-06-27 DIAGNOSIS — C679 Malignant neoplasm of bladder, unspecified: Secondary | ICD-10-CM

## 2013-07-01 ENCOUNTER — Telehealth: Payer: Self-pay | Admitting: Medical Oncology

## 2013-07-01 NOTE — Telephone Encounter (Signed)
LVMOM with patient to confirm tomorrow's appt. Requested for patient to bring a current list of her medications as well as to inform her of our free [place] parking. Requested pt to return call to confirm

## 2013-07-02 ENCOUNTER — Encounter (INDEPENDENT_AMBULATORY_CARE_PROVIDER_SITE_OTHER): Payer: Self-pay

## 2013-07-02 ENCOUNTER — Encounter: Payer: Self-pay | Admitting: Oncology

## 2013-07-02 ENCOUNTER — Ambulatory Visit (HOSPITAL_BASED_OUTPATIENT_CLINIC_OR_DEPARTMENT_OTHER): Payer: BC Managed Care – PPO | Admitting: Oncology

## 2013-07-02 ENCOUNTER — Other Ambulatory Visit: Payer: BC Managed Care – PPO

## 2013-07-02 ENCOUNTER — Ambulatory Visit (HOSPITAL_BASED_OUTPATIENT_CLINIC_OR_DEPARTMENT_OTHER): Payer: BC Managed Care – PPO

## 2013-07-02 VITALS — BP 151/78 | HR 69 | Temp 97.9°F | Resp 20 | Ht 65.5 in | Wt 195.8 lb

## 2013-07-02 DIAGNOSIS — C679 Malignant neoplasm of bladder, unspecified: Secondary | ICD-10-CM

## 2013-07-02 DIAGNOSIS — C674 Malignant neoplasm of posterior wall of bladder: Secondary | ICD-10-CM

## 2013-07-02 NOTE — Consult Note (Signed)
Reason for Referral: Bladder cancer.   HPI: 58 year old woman of [place] her she lived the majority of her life. She is a pleasant rather healthy woman with a past medical history significant for hypertension and uterine fibroids. She is status post hysterectomy in the past. Recently she developed microscopic hematuria and subsequently developed gross hematuria. She was evaluated by Dr. Louis Meckel and underwent initially flexible cystoscopy which showed a papillary tumor in the bladder measuring approximately 2.5 cm. She subsequently underwent TURBT on 06/13/2013 and she found to have 2-3 cm tumor in the posterior bladder with several small satellite tumors. 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 CT scan on 06/06/2013 confirm the presence of a 3.4 cm lesion in the anterior urinary bladder wall without any evidence of metastasis. There is no hydronephrosis or lymphadenopathy. Patient was referred to me for evaluation for possible neoadjuvant chemotherapy. Clinically, she is doing fairly well at this time. She did have gross hematuria and required catheterization which have been discontinued at this time. She continued to have bladder spasms at times which seems to be improving with medication. She is no longer reporting any hematuria or dysuria. She is not reporting any frequency or urgency. She does not report any constitutional symptoms and continued to be relatively active. 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 And has not reported any decline in her performance status or ability to perform activities of daily living. Has not reported any weight loss or appetite changes. Does not report any musculoskeletal complaints with arthralgias or myalgias.   Past Medical History  Diagnosis Date  . Hypertension   . History of uterine fibroid   . Bladder tumor   . Gross hematuria   . Bladder  spasm   . Wears contact lenses   :  Past Surgical History  Procedure Laterality Date  . Abdominal hysterectomy  2000  . Colposcopy    . Transurethral resection of bladder tumor with mitomycin-c N/A 06/13/2013    Procedure: CYSTOSCOPY TRANSURETHERAL RESECTION BLADDER TUMOR BILATERAL  RETROGRADE PYLEOGRAM INSULATION OF MITOMYCON C;  Surgeon: Ardis Hughs, MD;  Location: [facility];  Service: Urology;  Laterality: N/A;  . Cystoscopy w/ retrogrades N/A 06/13/2013    Procedure: CYSTOSCOPY WITH RETROGRADE PYELOGRAM;  Surgeon: Ardis Hughs, MD;  [facility]: [facility];  Service: Urology;  Laterality: N/A;  :  Current Outpatient Prescriptions  Medication Sig Dispense Refill  . acetaminophen-codeine (TYLENOL #3) 300-30 MG per tablet Take 1 tablet by mouth every 4 (four) hours as needed.  20 tablet  0  . Cholecalciferol (VITAMIN D3) 1000 UNITS CAPS Take 1 capsule by mouth daily.      [name] Kitchen doxycycline (VIBRAMYCIN) 100 MG capsule Take 1 capsule (100 mg total) by mouth 2 (two) times daily. One po bid x 7 days  14 capsule  0  . HYDRALAZINE-HCTZ PO Take by mouth every morning.       . hydrochlorothiazide (HYDRODIURIL) 25 MG tablet       . LOSARTAN POTASSIUM PO Take by mouth every morning.       . milk thistle 175 MG tablet Take 175 mg by mouth daily.      . phenazopyridine (PYRIDIUM) 200 MG tablet Take 1 tablet (200 mg total) by mouth 3 (three) times daily as needed for pain.  10 tablet  0  . traMADol (ULTRAM) 50 MG tablet Take 1 tablet (50  mg total) by mouth every 6 (six) hours as needed for severe pain.  30 tablet  0  . Trospium Chloride 60 MG CP24 Take 1 capsule (60 mg total) by mouth daily.  14 each  0  . vitamin E 400 UNIT capsule Take 400 Units by mouth daily.       No current facility-administered medications for this visit.      Allergies  Allergen Reactions  . Adhesive [Tape] Itching  :  No family history on file.:  History   Social History  .  Marital Status: Married    Spouse Name: N/A    Number of Children: N/A  . Years of Education: N/A   Occupational History  . Not on file.   Social History Main Topics  . Smoking status: Former Smoker -- 1.00 packs/day for 24 years    Types: Cigarettes    Quit date: 06/11/1998  . Smokeless tobacco: Never Used  . Alcohol Use: No  . Drug Use: No  . Sexual Activity: Not on file   Other Topics Concern  . Not on file   Social History Narrative  . No narrative on file  :  Constitutional: negative for anorexia, chills, fatigue and fevers Eyes: negative for icterus and irritation Ears, nose, mouth, throat, and face: negative for epistaxis, hearing loss and hoarseness Respiratory: negative for cough, dyspnea on exertion and wheezing Cardiovascular: negative for chest pain, orthopnea and palpitations Gastrointestinal: negative for abdominal pain, melena, reflux symptoms and vomiting Genitourinary:negative for dysuria, frequency and hematuria. she does report some bladder spasms. Integument/breast: negative for rash and skin color change Hematologic/lymphatic: negative for bleeding, easy bruising and lymphadenopathy Musculoskeletal:negative for arthralgias, back pain and bone pain Neurological: negative for headaches, seizures and vertigo Behavioral/Psych: negative for anxiety and fatigue Endocrine: negative for temperature intolerance Allergic/Immunologic: negative for anaphylaxis and urticaria  Exam: ECOG 0 Blood pressure 151/78, pulse 69, temperature 97.9 F (36.6 C), temperature source Oral, resp. rate 20, height 5' 5.5" (1.664 m), weight 195 lb 12.8 oz (88.814 kg). General appearance: alert, cooperative and appears stated age Head: Normocephalic, without obvious abnormality, atraumatic Nose: Nares normal. Septum midline. Mucosa normal. No drainage or sinus tenderness. Throat: lips, mucosa, and tongue normal; teeth and gums normal Neck: no adenopathy, no carotid bruit, no JVD,  supple, symmetrical, trachea midline and thyroid not enlarged, symmetric, no tenderness/mass/nodules Back: symmetric, no curvature. ROM normal. No CVA tenderness. Resp: clear to auscultation bilaterally Chest wall: no tenderness Cardio: regular rate and rhythm, S1, S2 normal, no murmur, click, rub or gallop GI: soft, non-tender; bowel sounds normal; no masses,  no organomegaly Extremities: extremities normal, atraumatic, no cyanosis or edema Pulses: 2+ and symmetric Skin: Skin color, texture, turgor normal. No rashes or lesions Lymph nodes: Cervical, supraclavicular, and axillary nodes normal.    Ct Abdomen Pelvis Wo Contrast  06/06/2013   CLINICAL DATA:  Right flank pain and hematuria for 3 weeks.  EXAM: CT ABDOMEN AND PELVIS WITHOUT CONTRAST  TECHNIQUE: Multidetector CT imaging of the abdomen and pelvis was performed following the standard protocol without intravenous contrast.  COMPARISON:  None.  FINDINGS: The lung bases are clear.  No evidence for free air.  No evidence for kidney stones or hydronephrosis. There is a dense lesion along the anterior bladder wall that measures 3.0 x 2.7 x 3.4 cm. Findings are concerning for bladder mucosal lesion. There is mild stranding or edema along the anterior aspect of the bladder which is also adjacent to the bladder lesion.No evidence  for urinary bladder stones.  A calcification along the hepatic dome may represent old granulomatous disease. No gross abnormality to the liver, spleen, pancreas or adrenal glands. No significant free fluid or lymphadenopathy. Uterus has been removed. There is colonic diverticulosis without acute colonic inflammation. Normal appearance of the gallbladder. There is high-density material within the appendix but no evidence for acute appendix inflammation.  No acute bone abnormality.  The sagittal images demonstrate small ventral hernias containing fat. No evidence for bowel involvement or bowel obstruction.  IMPRESSION: There is a  3.4 cm lesion involving the anterior urinary bladder wall. Findings are suggestive for a bladder neoplasm. In addition, there is mild stranding along the anterior aspect of the urinary bladder and adjacent to the lesion. This finding raises concern for neoplastic involvement beyond the urinary bladder wall.  No evidence for kidney stones or hydronephrosis.  Colonic diverticulosis.  No acute colonic inflammation.  These results were called by telephone at the time of interpretation on 06/06/2013 at 12:59 PM to Dr. Ripley Fraise , who verbally acknowledged these results.   Electronically Signed   By: Markus Daft M.D.   On: 06/06/2013 13:02    Assessment and Plan:   58 year old woman with the following issues:  1. 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 Her CT scans did not show any evidence of advanced disease. The natural course of this disease was discussed with the patient extensively. I do agree with Dr. Louis Meckel that a cystectomy is mandatory in this particular setting. I do not feel that bladder preserving approach is a viable option and they sound healthy individual. Neoadjuvant systemic chemotherapy was discussed today in detail. Risks associated with approach was discussed mainly the complications associated with chemotherapy. A regimen of Gemzar and cisplatin is the treatment of choice I discussed with her the logistics of administering these drug. Complications include but not limited to nausea, vomiting, fatigue, myelosuppression, neutropenia, neutropenic sepsis and less likely major illness such as hospitalization sepsis and death. Thromboembolism and is also common with these drugs. Renal toxicity associated with cisplatin was also emphasized and complications that includes renal sufficiency and electrolyte imbalance were also addressed. Infusion-related toxicity were also mentioned including fever and  rarely anaphylaxis. The benefit of this approach was discussed with the patient today including overall survival benefit in the 5-10% range associated with this particular cancer. I also explained to her that with aggressive approach of chemotherapy and surgery she is a reasonable chance of cure from this cancer. She will discuss this further with her family and we'll make a decision in the near future. If she is agreeable will arrange for a chemotherapy education class as well as scheduled to start chemotherapy.  2. IV access: I discussed with her the need for a Port-A-Cath insertion. The complications from that includes bleeding, infection and thrombosis. She'll also need an EMLA cream as well.  3. Antiemetics: She will be prescribed antiemetics appropriately to prevent nausea as well as rescue anti-emetics.

## 2013-07-02 NOTE — Progress Notes (Signed)
Please see consult note.  

## 2013-07-02 NOTE — Progress Notes (Signed)
Checked in new pateint with no financial issues.

## 2013-07-08 ENCOUNTER — Telehealth: Payer: Self-pay | Admitting: *Deleted

## 2013-07-08 NOTE — Telephone Encounter (Signed)
No additional note

## 2013-07-09 ENCOUNTER — Telehealth: Payer: Self-pay | Admitting: Medical Oncology

## 2013-07-09 NOTE — Telephone Encounter (Signed)
Per MD, called patient to inquire on whether she has decided to start chemotherapy d/t her calling to schedule Chemo Education class. Patient states that she wishes to attend the education class first to obtain more information and states she is "pretty sure" that she will decided to start treatment. Asked patient to call office to let us know after education class when she wishes to start. Patient gave verbal understanding and expressed thanks.  Patient also asked if she can schedule education class on Monday 04/13 d/t that being her day off. Informed pt that our scheduling dept can handle that appt for her and transferred her to Audie Clear, lead scheduler.  Dr. Alen Blew informed of patients wishes.

## 2013-07-11 ENCOUNTER — Other Ambulatory Visit: Payer: BC Managed Care – PPO

## 2013-07-14 ENCOUNTER — Encounter: Payer: Self-pay | Admitting: Oncology

## 2013-07-14 ENCOUNTER — Other Ambulatory Visit: Payer: BC Managed Care – PPO

## 2013-07-14 NOTE — Progress Notes (Signed)
No date for treatment as of today. °

## 2013-07-21 ENCOUNTER — Ambulatory Visit: Payer: BC Managed Care – PPO | Admitting: Family

## 2013-07-21 DIAGNOSIS — Z0289 Encounter for other administrative examinations: Secondary | ICD-10-CM

## 2013-08-04 ENCOUNTER — Ambulatory Visit (INDEPENDENT_AMBULATORY_CARE_PROVIDER_SITE_OTHER): Payer: BC Managed Care – PPO | Admitting: Family

## 2013-08-04 ENCOUNTER — Telehealth: Payer: Self-pay | Admitting: Family

## 2013-08-04 ENCOUNTER — Encounter: Payer: Self-pay | Admitting: Family

## 2013-08-04 VITALS — BP 140/82 | HR 77 | Temp 97.9°F | Resp 16 | Ht 66.5 in | Wt 189.0 lb

## 2013-08-04 DIAGNOSIS — C679 Malignant neoplasm of bladder, unspecified: Secondary | ICD-10-CM

## 2013-08-04 DIAGNOSIS — I10 Essential (primary) hypertension: Secondary | ICD-10-CM

## 2013-08-04 NOTE — Assessment & Plan Note (Signed)
Management per urology and oncology. 30 minutes spent face to face with pt today.  >50% of this time was spent counseling patient on blood pressure, and discussion re: treatment for her bladder cancer.

## 2013-08-04 NOTE — Progress Notes (Signed)
Pre visit review using our clinic review tool, if applicable. No additional management support is needed unless otherwise documented below in the visit note. 

## 2013-08-04 NOTE — Telephone Encounter (Signed)
Relevant patient education assigned to patient using Emmi. ° °

## 2013-08-04 NOTE — Patient Instructions (Signed)
Please complete lab work prior to leaving. Schedule fasting physical at the front desk. Welcome to [facility]!

## 2013-08-04 NOTE — Assessment & Plan Note (Signed)
Fair BP control. Continue HCTZ and losartan, obtain follow up bmet.

## 2013-08-04 NOTE — Progress Notes (Signed)
   Subjective:    Patient ID: Ruth Maldonado, female    DOB: 1956/03/04, 58 y.o.   MRN: [id number]  HPI  Ms. Ruth Maldonado is a 58 yr old female who presents today to establish care.  1) bladder Cancer- Reports that she was sent to urologist from her GYN.  s/p transurethral resection of bladder tumor.  This procedure was performed by Dr. Louis Maldonado. She was diagnosed in March of this year following an episode of hematuria and abnormal CT scan which showed 3.4 cm lesion involving the anterior urinary bladder wall. She met with Dr. Alen Maldonado who has recommended systemic chemo. She is contemplating this and has not yet made a final decision if she wants to proceed.  2) HTN- Was diagnosed at age 11. She is maintained on losartan and hctz.   BP Readings from Last 3 Encounters:  08/04/13 140/82  07/02/13 151/78  06/14/13 126/58     Review of Systems  Constitutional:       Reportsthat she has improved her diet recently.   denies current gross hematuria or pelvic pain     Objective:   Physical Exam  Constitutional: She is oriented to person, place, and time. She appears well-developed and well-nourished. No distress.  HENT:  Head: Normocephalic and atraumatic.  Cardiovascular: Normal rate and regular rhythm.   No murmur heard. Pulmonary/Chest: Effort normal and breath sounds normal. No respiratory distress. She has no wheezes. She has no rales. She exhibits no tenderness.  Musculoskeletal: She exhibits no edema.  Neurological: She is alert and oriented to person, place, and time.  Psychiatric: She has a normal mood and affect. Her behavior is normal. Judgment and thought content normal.          Assessment & Plan:

## 2013-08-05 ENCOUNTER — Encounter: Payer: Self-pay | Admitting: Family

## 2013-08-05 LAB — BASIC METABOLIC PANEL
BUN: 9 mg/dL (ref 6–23)
CO2: 25 mEq/L (ref 19–32)
CREATININE: 0.9 mg/dL (ref 0.50–1.10)
Calcium: 9.9 mg/dL (ref 8.4–10.5)
Chloride: 102 mEq/L (ref 96–112)
GLUCOSE: 88 mg/dL (ref 70–99)
Potassium: 4 mEq/L (ref 3.5–5.3)
Sodium: 139 mEq/L (ref 135–145)

## 2013-08-11 ENCOUNTER — Telehealth: Payer: Self-pay | Admitting: *Deleted

## 2013-08-11 MED ORDER — LOSARTAN POTASSIUM 50 MG PO TABS: 50.0000 mg | ORAL_TABLET | Freq: Every day | ORAL | Status: DC

## 2013-08-11 MED ORDER — HYDROCHLOROTHIAZIDE 25 MG PO TABS: 25.0000 mg | ORAL_TABLET | Freq: Every day | ORAL | Status: DC

## 2013-08-11 NOTE — Telephone Encounter (Signed)
Pt left message requesting refill of losartan and HCTZ. Refills sent. Left message on pt's voicemail.

## 2013-09-01 ENCOUNTER — Encounter: Payer: Self-pay | Admitting: Family

## 2013-09-01 ENCOUNTER — Ambulatory Visit (INDEPENDENT_AMBULATORY_CARE_PROVIDER_SITE_OTHER): Payer: BC Managed Care – PPO | Admitting: Family

## 2013-09-01 VITALS — BP 122/80 | HR 67 | Temp 97.9°F | Resp 18 | Ht 66.5 in | Wt 187.0 lb

## 2013-09-01 DIAGNOSIS — C679 Malignant neoplasm of bladder, unspecified: Secondary | ICD-10-CM

## 2013-09-01 DIAGNOSIS — Z0000 Encounter for general adult medical examination without abnormal findings: Secondary | ICD-10-CM

## 2013-09-01 NOTE — Progress Notes (Signed)
Pre visit review using our clinic review tool, if applicable. No additional management support is needed unless otherwise documented below in the visit note. 

## 2013-09-01 NOTE — Assessment & Plan Note (Addendum)
58 yr old female, current declining further treatment of her bladder cancer. I do not think that she grasps the seriousness of her condition at this point.  We discussed that it is possible that her condition could become metastatic and that her treatment options may become limited if she does not pursue further treatment. We discussed her concerns today and she is agreeable to a second opinion with oncology.  She would like to be referred to [facility].  Will arrange.

## 2013-09-01 NOTE — Patient Instructions (Signed)
You will be contacted about your referral to Oncology. Follow up in 3 months.

## 2013-09-01 NOTE — Progress Notes (Signed)
Subjective:    Patient ID: Ruth Maldonado, female    DOB: 1955/11/06, 58 y.o.   MRN: [id number]  HPI Here for complete physical exam. Has no complaints other than swollen area medial side of elbows bilaterally.  Immunizations: tetanus 4 yrs ago.   Diet: eating organic/vegetarian diet Exercise: walks twice a week Colonoscopy: 2 yrs ago- normal per pt.   Dexa: due Pap Smear:  Had pelvic exam last week with GYN Mammogram:  Due   Bladder cancer- she is taking uribel.  She is established Dr. Dayna Ramus- urology with [facility].  Last saw him 1 month ago.  She also sees Dr.  Alen Blew.  Cystectomy and chemo have been recommended.  She has attended the chemotherapy class and at this point tells me that she has decided not to pursue any further treatment for her bladder cancer.  Notes that she is scared about the side effects of the chemo and need to "wear a bag" for the rest of her life.   Review of Systems  Constitutional: Negative for fever, chills, diaphoresis, fatigue and unexpected weight change.       Walking once/twice per week 20-30 minutes.  HENT: Negative for congestion, hearing loss, rhinorrhea and sneezing.   Respiratory: Negative for cough, shortness of breath and wheezing.   Cardiovascular: Negative for chest pain, palpitations and leg swelling.  Gastrointestinal: Negative for abdominal pain, diarrhea, constipation and blood in stool.  Endocrine: Negative for cold intolerance, heat intolerance, polydipsia, polyphagia and polyuria.       Has occasional hot flashes.  Genitourinary: Negative for dysuria, frequency and difficulty urinating.       Nocturia 2-3 times per night since tumor removal via cystoscopy. Curbs this by limiting fluids past 9-10 pm.  Musculoskeletal: Positive for myalgias. Negative for arthralgias.       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  Neurological: Negative for dizziness,  weakness and headaches.  Hematological: Negative for adenopathy.  Psychiatric/Behavioral: Negative for sleep disturbance.   Diet-all vegetarian and organic for about two months since bladder tumor diagnosis. Has lost about 20 pounds.  Past Medical History  Diagnosis Date  . Hypertension   . History of uterine fibroid   . Bladder tumor   . Gross hematuria   . Bladder spasm   . Wears contact lenses     History   Social History  . Marital Status: Married    Spouse Name: N/A    Number of Children: N/A  . Years of Education: N/A   Occupational History  . Not on file.   Social History Main Topics  . Smoking status: Former Smoker -- 1.00 packs/day for 24 years    Types: Cigarettes    Quit date: 06/11/1998  . Smokeless tobacco: Never Used  . Alcohol Use: No  . Drug Use: No     Comment: previous hx of cocaine and heroine abuse- quit 18 years ago  . Sexual Activity: Not on file   Other Topics Concern  . 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    Works in [employer]   6 grand children   Married 52 years   Husband is 77 and retired   1 son- lives locally.     Past Surgical History  Procedure Laterality Date  . Abdominal hysterectomy  2000  . Colposcopy    .  Transurethral resection of bladder tumor with mitomycin-c N/A 06/13/2013    Procedure: CYSTOSCOPY TRANSURETHERAL RESECTION BLADDER TUMOR BILATERAL  RETROGRADE PYLEOGRAM INSULATION OF MITOMYCON C;  Surgeon: Ardis Hughs, MD;  Location: [facility];  Service: Urology;  Laterality: N/A;  . Cystoscopy w/ retrogrades N/A 06/13/2013    Procedure: CYSTOSCOPY WITH RETROGRADE PYELOGRAM;  Surgeon: Ardis Hughs, MD;  Location: [facility];  Service: Urology;  Laterality: N/A;    Family History  Problem Relation Age of Onset  . Epilepsy Mother     temporal lobe epilepsy  . Emphysema Mother   . Cancer Mother      lung  . Hypertension Mother   . Heart attack Father     Allergies  Allergen Reactions  . Adhesive [Tape] Itching    Current Outpatient Prescriptions on File Prior to Visit  Medication Sig Dispense Refill  . Cholecalciferol (VITAMIN D3) 1000 UNITS CAPS Take 1 capsule by mouth daily.      . hydrochlorothiazide (HYDRODIURIL) 25 MG tablet Take 1 tablet (25 mg total) by mouth daily.  30 tablet  1  . IODINE, KELP, PO TAKE 1 DROPPER PER DAY      . losartan (COZAAR) 50 MG tablet Take 1 tablet (50 mg total) by mouth daily.  30 tablet  1  . Meth-Hyo-M Bl-Na Phos-Ph Sal (URIBEL) 118 MG CAPS Take 1 capsule by mouth 4 (four) times daily.      . milk thistle 175 MG tablet Take 175 mg by mouth daily.      [name] Kitchen OVER THE COUNTER MEDICATION GRAVIOLA 600mg .  Take 2 capsules twice a day.      [name] Kitchen OVER THE COUNTER MEDICATION TUMERIC.  1 dropper per day.      . vitamin E 400 UNIT capsule Take 400 Units by mouth daily.       No current facility-administered medications on file prior to visit.    BP 122/80  Pulse 67  Temp(Src) 97.9 F (36.6 C) (Oral)  Resp 18  Ht 5' 6.5" (1.689 m)  Wt 187 lb (84.823 kg)  BMI 29.73 kg/m2  SpO2 99%       Objective:   Physical Exam  Constitutional: She is oriented to person, place, and time. She appears well-developed and well-nourished. No distress.  HENT:  Head: Normocephalic and atraumatic.  Right Ear: External ear normal.  Left Ear: External ear normal.  Mouth/Throat: No oropharyngeal exudate.  Bilateral TM pearly gray with good cone of light.  Eyes: Pupils are equal, round, and reactive to light. Right eye exhibits discharge. Left eye exhibits no discharge. No scleral icterus.  Neck: Normal range of motion. Neck supple. No JVD present. No thyromegaly present.  Cardiovascular: Normal rate, regular rhythm and intact distal pulses.  Exam reveals friction rub. Exam reveals no gallop.   No murmur heard. Pulmonary/Chest: Effort normal and breath sounds normal. No  respiratory distress. She has no wheezes. She has no rales.  Abdominal: She exhibits no distension and no mass. There is no tenderness.  Musculoskeletal: Normal range of motion. She exhibits no edema and no tenderness.  Swelling noted bilaterally inner elbows left>right.  Lymphadenopathy:    She has no cervical adenopathy.  Neurological: She is alert and oriented to person, place, and time. No cranial nerve deficit.  Skin: Skin is warm. No rash noted. No erythema.  Psychiatric: She has a normal mood and affect. Her behavior is normal. Judgment and thought content normal.  breast:  Normal breast  exam bilaterally,  No masses noted, no axillary LAD.        Assessment & Plan:  Patient seen by Jake Bathe NP-student.  I have personally seen and examined patient and agree with Ms. Whitmire's assessment and plan.  Approximately, 15 minutes spent counseling pt on her bladder cancer and treatment.

## 2013-09-01 NOTE — Assessment & Plan Note (Signed)
Continue healthy diet, exercise. Obtain fasting labs.  Refer for mammogram, dexa. Discussed zostavax and advised pt to check coverage with her insurance then book a nurse visit for administration.

## 2013-09-02 ENCOUNTER — Ambulatory Visit: Payer: BC Managed Care – PPO

## 2013-09-02 ENCOUNTER — Other Ambulatory Visit: Payer: BC Managed Care – PPO | Admitting: Lab

## 2013-09-02 ENCOUNTER — Telehealth: Payer: Self-pay | Admitting: *Deleted

## 2013-09-02 ENCOUNTER — Ambulatory Visit: Payer: BC Managed Care – PPO | Admitting: Hematology & Oncology

## 2013-09-02 ENCOUNTER — Telehealth: Payer: Self-pay | Admitting: Hematology & Oncology

## 2013-09-02 LAB — LIPID PANEL
CHOL/HDL RATIO: 2.8 ratio
Cholesterol: 135 mg/dL (ref 0–200)
HDL: 48 mg/dL (ref >39)
LDL Cholesterol: 73 mg/dL (ref 0–99)
Triglycerides: 69 mg/dL (ref <150)
VLDL: 14 mg/dL (ref 0–40)

## 2013-09-02 LAB — CBC WITH DIFFERENTIAL/PLATELET
BASOS ABS: 0 10*3/uL (ref 0.0–0.1)
Basophils Relative: 0 % (ref 0–1)
EOS ABS: 0.1 10*3/uL (ref 0.0–0.7)
Eosinophils Relative: 2 % (ref 0–5)
HCT: 41.9 % (ref 36.0–46.0)
Hemoglobin: 13.5 g/dL (ref 12.0–15.0)
LYMPHS ABS: 2.4 10*3/uL (ref 0.7–4.0)
Lymphocytes Relative: 49 % — ABNORMAL HIGH (ref 12–46)
MCH: 24.5 pg — AB (ref 26.0–34.0)
MCHC: 32.2 g/dL (ref 30.0–36.0)
MCV: 75.9 fL — ABNORMAL LOW (ref 78.0–100.0)
Monocytes Absolute: 0.3 10*3/uL (ref 0.1–1.0)
Monocytes Relative: 7 % (ref 3–12)
NEUTROS PCT: 42 % — AB (ref 43–77)
Neutro Abs: 2 10*3/uL (ref 1.7–7.7)
PLATELETS: 252 10*3/uL (ref 150–400)
RBC: 5.52 MIL/uL — ABNORMAL HIGH (ref 3.87–5.11)
RDW: 15 % (ref 11.5–15.5)
WBC: 4.8 10*3/uL (ref 4.0–10.5)

## 2013-09-02 LAB — HEPATIC FUNCTION PANEL
ALBUMIN: 3.9 g/dL (ref 3.5–5.2)
ALK PHOS: 75 U/L (ref 39–117)
ALT: 61 U/L — ABNORMAL HIGH (ref 0–35)
AST: 50 U/L — ABNORMAL HIGH (ref 0–37)
BILIRUBIN TOTAL: 0.5 mg/dL (ref 0.2–1.2)
Bilirubin, Direct: 0.1 mg/dL (ref 0.0–0.3)
Indirect Bilirubin: 0.4 mg/dL (ref 0.2–1.2)
TOTAL PROTEIN: 7.1 g/dL (ref 6.0–8.3)

## 2013-09-02 LAB — TSH: TSH: 2.463 u[IU]/mL (ref 0.350–4.500)

## 2013-09-02 NOTE — Telephone Encounter (Signed)
Received call from pt that she was contacted by Dr Antonieta Pert office for appt. And pt thought we were going to refer her to [facility] for 2nd opinion. Pt states she is at work today and cannot go in today for appt and does not understand the urgency.  Please advise.

## 2013-09-02 NOTE — Telephone Encounter (Signed)
Delsa Sale,   I requested Dr. Dorthula Matas at [facility].  Could you please make arrangements to send her to [facility] and notify pt and Dr. Antonieta Pert office that referral went to them in error?  thanks

## 2013-09-02 NOTE — Telephone Encounter (Signed)
Per Md to sch patient for New patient apt on 09/02/12 to see him at 2:30.  Apt was scheduled and patient was called and given schedule.  Patient was at work and can not come today.

## 2013-09-03 ENCOUNTER — Telehealth: Payer: Self-pay | Admitting: Family

## 2013-09-03 DIAGNOSIS — B1920 Unspecified viral hepatitis C without hepatic coma: Secondary | ICD-10-CM

## 2013-09-03 DIAGNOSIS — R7989 Other specified abnormal findings of blood chemistry: Secondary | ICD-10-CM

## 2013-09-03 DIAGNOSIS — R945 Abnormal results of liver function studies: Principal | ICD-10-CM

## 2013-09-03 NOTE — Telephone Encounter (Signed)
Yes, sorry. Referral done, pt aware

## 2013-09-03 NOTE — Telephone Encounter (Signed)
LFT mildly elevated.  I would like her to complete some additional testing (below).  I did ask Delsa Sale to schedule her at [facility] for second opinion- not Dr. Marin Olp.

## 2013-09-03 NOTE — Telephone Encounter (Signed)
Notified pt. States she was contacted yesterday and has appt with oncology at [facility] for 09/15/13. Also states she was previously diagnosed with Hepatitis C. Has met with a specialist at [facility] and was waiting on a new treatment to come out. States she is aware that there is new treatment available now and will be contacting them to arrange appointment. Problem list has been update. 

## 2013-09-08 ENCOUNTER — Ambulatory Visit (HOSPITAL_BASED_OUTPATIENT_CLINIC_OR_DEPARTMENT_OTHER)
Admission: RE | Admit: 2013-09-08 | Discharge: 2013-09-08 | Disposition: A | Discharge status: Home / Self Care | Payer: BC Managed Care – PPO | Source: Ambulatory Visit | Attending: Family | Admitting: Family

## 2013-09-08 ENCOUNTER — Ambulatory Visit: Admission: RE | Admit: 2013-09-08 | Payer: BC Managed Care – PPO | Source: Ambulatory Visit

## 2013-09-08 DIAGNOSIS — Z1231 Encounter for screening mammogram for malignant neoplasm of breast: Secondary | ICD-10-CM | POA: Insufficient documentation

## 2013-12-15 ENCOUNTER — Encounter: Payer: Self-pay | Admitting: Family

## 2013-12-15 ENCOUNTER — Telehealth: Payer: Self-pay | Admitting: Family

## 2013-12-15 ENCOUNTER — Ambulatory Visit (INDEPENDENT_AMBULATORY_CARE_PROVIDER_SITE_OTHER): Payer: BC Managed Care – PPO | Admitting: Family

## 2013-12-15 VITALS — BP 140/88 | HR 67 | Temp 98.2°F | Resp 16 | Ht 66.5 in | Wt 180.0 lb

## 2013-12-15 DIAGNOSIS — Z23 Encounter for immunization: Secondary | ICD-10-CM

## 2013-12-15 DIAGNOSIS — C679 Malignant neoplasm of bladder, unspecified: Secondary | ICD-10-CM

## 2013-12-15 DIAGNOSIS — I10 Essential (primary) hypertension: Secondary | ICD-10-CM

## 2013-12-15 NOTE — Telephone Encounter (Signed)
Number given.

## 2013-12-15 NOTE — Assessment & Plan Note (Signed)
BP slightly elevated today, did not take AM meds. Resume.

## 2013-12-15 NOTE — Assessment & Plan Note (Signed)
Again reinforced the importance of following through with referrals and seeking recommended treatment. She is agreeable to keep future appointments.

## 2013-12-15 NOTE — Progress Notes (Signed)
Pre visit review using our clinic review tool, if applicable. No additional management support is needed unless otherwise documented below in the visit note. 

## 2013-12-15 NOTE — Patient Instructions (Signed)
We will contact you with the number to reschedule your oncology apt. Restart BP meds. Follow up in 3 months.

## 2013-12-15 NOTE — Progress Notes (Signed)
Subjective:    Patient ID: Ruth Maldonado, female    DOB: [birth year], 58 y.o.   MRN: [id number]  HPI  Ruth Maldonado is a 58 yr old female who presents today for follow up.  1) HTN- she is maintianed on hctz, losartan. She did not take her pills this AM.   BP Readings from Last 3 Encounters:  12/15/13 140/88  09/01/13 122/80  08/04/13 140/82   2) Hx of bladder cancer- had transurethral resection of bladder tumor (Dr. Louis Meckel 3/15) Saw Dr. Alen Blew (oncology)  who recommended systemic chemo.  I saw the patient in May and recommended follow up with both specialists- she was contemplating if she would proceed with treatment at that time. It was recommended that she undergo cystectomy and chemo. I saw her back in June, and at that time she still was unsure if she wished to proceed with treatment due fear about side effects of chemo and needing to "wear a bag" for the rest of her life. She was agreeable to a second opinion with urology and she was referred to [facility].  She did not keep this appointment due to "family issues."  She is now requesting second opinion from urology.  She denies current hematuria but notes that she did have an episode several months ago. She still has not made a decision if she wishes to proceed with treatment.   Wt Readings from Last 3 Encounters:  12/15/13 179 lb 15.7 oz (81.638 kg)  09/01/13 187 lb (84.823 kg)  08/04/13 189 lb (85.73 kg)     Review of Systems See HPI  Past Medical History  Diagnosis Date  . Hypertension   . History of uterine fibroid   . Bladder tumor   . Gross hematuria   . Bladder spasm   . Wears contact lenses     History   Social History  . Marital Status: Married    Spouse Name: N/A    Number of Children: N/A  . Years of Education: N/A   Occupational History  . Not on file.   Social History Main Topics  . Smoking status: Former Smoker -- 1.00 packs/day for 24 years    Types: Cigarettes    Quit date:  06/11/1998  . Smokeless tobacco: Never Used  . Alcohol Use: No  . Drug Use: No     Comment: previous hx of cocaine and heroine abuse- quit 18 years ago  . Sexual Activity: Not on file   Other Topics Concern  . 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    Works in [employer]   6 grand children   Married 91 years   Husband is 79 and retired   1 son- lives locally.     Past Surgical History  Procedure Laterality Date  . Abdominal hysterectomy  2000  . Colposcopy    . Transurethral resection of bladder tumor with mitomycin-c N/A 06/13/2013    Procedure: CYSTOSCOPY TRANSURETHERAL RESECTION BLADDER TUMOR BILATERAL  RETROGRADE PYLEOGRAM INSULATION OF MITOMYCON C;  Surgeon: Ardis Hughs, MD;  Location: [facility];  Service: Urology;  Laterality: N/A;  . Cystoscopy w/ retrogrades N/A 06/13/2013    Procedure: CYSTOSCOPY WITH RETROGRADE PYELOGRAM;  Surgeon: Ardis Hughs, MD;  Location: [facility];  Service: Urology;  Laterality: N/A;    Family History  Problem Relation Age of Onset  . Epilepsy Mother  temporal lobe epilepsy  . Emphysema Mother   . Cancer Mother     lung  . Hypertension Mother   . Heart attack Father     Allergies  Allergen Reactions  . Adhesive [Tape] Itching    Current Outpatient Prescriptions on File Prior to Visit  Medication Sig Dispense Refill  . Cholecalciferol (VITAMIN D3) 1000 UNITS CAPS Take 1 capsule by mouth daily.      . hydrochlorothiazide (HYDRODIURIL) 25 MG tablet Take 1 tablet (25 mg total) by mouth daily.  30 tablet  1  . IODINE, KELP, PO TAKE 1 DROPPER PER DAY      . losartan (COZAAR) 50 MG tablet Take 1 tablet (50 mg total) by mouth daily.  30 tablet  1  . Meth-Hyo-M Bl-Na Phos-Ph Sal (URIBEL) 118 MG CAPS Take 1 capsule by mouth 4 (four) times daily.      . milk thistle 175 MG tablet Take 175 mg by mouth daily.      [name] Kitchen OVER THE  COUNTER MEDICATION GRAVIOLA 600mg .  Take 2 capsules twice a day.      [name] Kitchen OVER THE COUNTER MEDICATION TUMERIC.  1 dropper per day.      . vitamin E 400 UNIT capsule Take 400 Units by mouth daily.       No current facility-administered medications on file prior to visit.    BP 140/88  Pulse 67  Temp(Src) 98.2 F (36.8 C) (Oral)  Resp 16  Ht 5' 6.5" (1.689 m)  Wt 179 lb 15.7 oz (81.638 kg)  BMI 28.62 kg/m2  SpO2 99%       Objective:   Physical Exam  Constitutional: She is oriented to person, place, and time. She appears well-developed and well-nourished. No distress.  Cardiovascular: Normal rate and regular rhythm.   No murmur heard. Pulmonary/Chest: Effort normal and breath sounds normal. No respiratory distress. She has no wheezes. She has no rales. She exhibits no tenderness.  Neurological: She is alert and oriented to person, place, and time.  Psychiatric: She has a normal mood and affect. Her behavior is normal. Judgment and thought content normal.          Assessment & Plan:

## 2013-12-15 NOTE — Telephone Encounter (Signed)
Pt missed apt with oncology and would like to reschedule. Do you have the number/dr's name who she was scheduled with please?

## 2014-03-16 ENCOUNTER — Ambulatory Visit (INDEPENDENT_AMBULATORY_CARE_PROVIDER_SITE_OTHER): Payer: BC Managed Care – PPO | Admitting: Family

## 2014-03-16 ENCOUNTER — Encounter: Payer: Self-pay | Admitting: Family

## 2014-03-16 VITALS — BP 138/82 | HR 62 | Temp 97.8°F | Resp 16 | Ht 66.5 in | Wt 187.2 lb

## 2014-03-16 DIAGNOSIS — C679 Malignant neoplasm of bladder, unspecified: Secondary | ICD-10-CM

## 2014-03-16 DIAGNOSIS — I10 Essential (primary) hypertension: Secondary | ICD-10-CM

## 2014-03-16 NOTE — Assessment & Plan Note (Addendum)
She has declined treatment thus far (recommendations for chemo and cystectomy). I have asked her to have urology send me their notes. Advised her to follow through with urology recommendations and we discussed the risk of untreated bladder cancer. She verbalizes understanding.

## 2014-03-16 NOTE — Progress Notes (Signed)
Pre visit review using our clinic review tool, if applicable. No additional management support is needed unless otherwise documented below in the visit note. 

## 2014-03-16 NOTE — Progress Notes (Signed)
Subjective:    Patient ID: Ruth Maldonado, female    DOB: 1955-10-17, 58 y.o.   MRN: [id number]  HPI  Pt presents today for follow up:  1) HTN- Patient is currently maintained on the following medications for blood pressure: none- stopped. Taking lavender.  Patient reports good compliance with blood pressure medications.  Last 3 blood pressure readings in our office are as follows: BP Readings from Last 3 Encounters:  03/16/14 138/82  12/15/13 140/88  09/01/13 122/80   2) Bladder Cancer- will see a new urologist on Wednesday who is located in [place].  She has see two oncologists. She has declined to return to the oncologist.  + bladder pain, which she uses prn ibuprofen.     Review of Systems See HPI  Past Medical History  Diagnosis Date  . Hypertension   . History of uterine fibroid   . Bladder tumor   . Gross hematuria   . Bladder spasm   . Wears contact lenses     History   Social History  . Marital Status: Married    Spouse Name: N/A    Number of Children: N/A  . Years of Education: N/A   Occupational History  . Not on file.   Social History Main Topics  . Smoking status: Former Smoker -- 1.00 packs/day for 24 years    Types: Cigarettes    Quit date: 06/11/1998  . Smokeless tobacco: Never Used  . Alcohol Use: No  . Drug Use: No     Comment: previous hx of cocaine and heroine abuse- quit 18 years ago  . Sexual Activity: Not on file   Other Topics Concern  . 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    Works in [employer]   6 grand children   Married 28 years   Husband is 14 and retired   1 son- lives locally.     Past Surgical History  Procedure Laterality Date  . Abdominal hysterectomy  2000  . Colposcopy    . Transurethral resection of bladder tumor with mitomycin-c N/A 06/13/2013    Procedure: CYSTOSCOPY TRANSURETHERAL RESECTION BLADDER TUMOR BILATERAL  RETROGRADE  PYLEOGRAM INSULATION OF MITOMYCON C;  Surgeon: Ardis Hughs, MD;  Location: [facility];  Service: Urology;  Laterality: N/A;  . Cystoscopy w/ retrogrades N/A 06/13/2013    Procedure: CYSTOSCOPY WITH RETROGRADE PYELOGRAM;  Surgeon: Ardis Hughs, MD;  Location: [facility];  Service: Urology;  Laterality: N/A;    Family History  Problem Relation Age of Onset  . Epilepsy Mother     temporal lobe epilepsy  . Emphysema Mother   . Cancer Mother     lung  . Hypertension Mother   . Heart attack Father     Allergies  Allergen Reactions  . Adhesive [Tape] Itching    Current Outpatient Prescriptions on File Prior to Visit  Medication Sig Dispense Refill  . Cholecalciferol (VITAMIN D3) 1000 UNITS CAPS Take 1 capsule by mouth daily.    . IODINE, KELP, PO TAKE 1 DROPPER PER DAY    . Meth-Hyo-M Bl-Na Phos-Ph Sal (URIBEL) 118 MG CAPS Take 1 capsule by mouth 4 (four) times daily.    . milk thistle 175 MG tablet Take 175 mg by mouth daily.    [name] Kitchen OVER THE COUNTER MEDICATION TUMERIC.  1 dropper per day.    [name] Kitchen OVER THE COUNTER MEDICATION LEMON GRASS  ESSENTIAL OILS.  Once a day.    . vitamin E 400 UNIT capsule Take 400 Units by mouth daily.     No current facility-administered medications on file prior to visit.    BP 138/82 mmHg  Pulse 62  Temp(Src) 97.8 F (36.6 C) (Oral)  Resp 16  Ht 5' 6.5" (1.689 m)  Wt 187 lb 3.2 oz (84.913 kg)  BMI 29.77 kg/m2  SpO2 99%       Objective:   Physical Exam  Constitutional: She is oriented to person, place, and time. She appears well-developed and well-nourished. No distress.  Cardiovascular: Normal rate and regular rhythm.   No murmur heard. Pulmonary/Chest: Effort normal and breath sounds normal. No respiratory distress. She has no wheezes. She has no rales. She exhibits no tenderness.  Abdominal: Soft. Bowel sounds are normal.  Mild suprapubic tenderness.   Neurological: She is alert and oriented to  person, place, and time.  Psychiatric: She has a normal mood and affect. Her behavior is normal. Judgment and thought content normal.          Assessment & Plan:  20 min spent with pt.  >50% of this time was spent counseling pt on importance of following through with specialist recommendations for bladder CA.

## 2014-03-16 NOTE — Addendum Note (Signed)
Addended by: Debbrah Alar on: 03/16/2014 09:51 AM   Modules accepted: Level of Service, SmartSet

## 2014-03-16 NOTE — Patient Instructions (Signed)
Please keep your upcoming appointment with urology. Please schedule a follow up appointment in 3 months.

## 2014-03-16 NOTE — Assessment & Plan Note (Addendum)
Stable off of meds.  Monitor.  

## 2014-04-25 ENCOUNTER — Other Ambulatory Visit: Payer: Self-pay | Admitting: Family

## 2014-04-27 NOTE — Telephone Encounter (Signed)
Rx request to pharmacy; Patient informed, understood & agreed, she was "driving [and had dog in car]", so she will call back to office to schedule F/U w/i the next 30 days/SLS

## 2014-04-27 NOTE — Telephone Encounter (Signed)
BP was ok off of meds last visit so I left her off.  Please contact pt and ask her if she has been checking her BP and if it has been running high.

## 2014-04-27 NOTE — Telephone Encounter (Signed)
Ok to send refill on hctz.  Is BP 120/85 with losartan?  If so, ok to refill losartan.  If she is not taking losartan then do not refill. She should follow up in our office in 1 month for BP recheck.

## 2014-04-27 NOTE — Telephone Encounter (Signed)
Patient states that she was using "Essential oil-Lavender" but did not want to D/C her medications for HTN; explained that she reported no longer taking BP medications at her December OV to provider, so in turn they were D/C from her medication list. Pt reports that she has been checking BP at home with monitor and averaging about 120/85 and that w/o the HCTZ, "her hands tend to swell". Informed patient that this information will be forwarded back to PCP, and we will contact her with response/SLS Please Advise on refills.

## 2014-04-27 NOTE — Telephone Encounter (Signed)
Pt last seen 03/2014 and is due for follow up in  March. Looks like HCTZ and losartan may have "fallen off "of pt's med list. Please advise if ok to refill as below:  Medication name:  Name from pharmacy:  hydrochlorothiazide (HYDRODIURIL) 25 MG tablet HYDROCHLOROTHIAZIDE 25MG  TAB     The source prescription has been discontinued.    Sig: TAKE ONE TABLET BY MOUTH ONCE DAILY    Dispense: 30 tablet   Refills: 0   Start: 04/25/2014   Class: Normal    Requested on: 08/11/2013    Originally ordered on: 07/02/2013 12/24/2013 Order History and Details           Medication name:  Name from pharmacy:  losartan (COZAAR) 50 MG tablet LOSARTAN 50MG  TAB    The source prescription has been discontinued.    Sig: TAKE ONE TABLET BY MOUTH ONCE DAILY    Dispense: 30 tablet   Refills: 0   Start: 04/25/2014   Class: Normal    Requested on: 08/11/2013    Originally ordered on: 06/06/2013 12/29/2013

## 2014-05-28 ENCOUNTER — Telehealth: Payer: Self-pay | Admitting: Family

## 2014-05-28 NOTE — Telephone Encounter (Signed)
Caller name:Marchele Wisher Relationship to patient:Self Can be reached:[phone]   Reason for call: Patient calling in reference RX - states recv call that needs appt before filled. Does the PT need full CPE or just a follow up?

## 2014-05-28 NOTE — Telephone Encounter (Signed)
Spoke with pt and advised her that per 03/16/14 office note, she was only using oils and no longer taking Rx BP meds. Pt states that she still takes BP meds as needed if she notices her BP starting to run a little high. Pt states she received a 30 day supply of medications and I scheduled her a follow up for 06/15/14 at 8am as she states she is off work on Mondays and Fridays.

## 2014-06-15 ENCOUNTER — Ambulatory Visit (INDEPENDENT_AMBULATORY_CARE_PROVIDER_SITE_OTHER): Payer: BLUE CROSS/BLUE SHIELD | Admitting: Family

## 2014-06-15 ENCOUNTER — Telehealth: Payer: Self-pay | Admitting: Family

## 2014-06-15 ENCOUNTER — Encounter: Payer: Self-pay | Admitting: Family

## 2014-06-15 ENCOUNTER — Encounter: Payer: BC Managed Care – PPO | Admitting: Family

## 2014-06-15 VITALS — BP 170/90 | HR 69 | Temp 98.0°F | Resp 16 | Ht 66.5 in | Wt 185.0 lb

## 2014-06-15 DIAGNOSIS — B182 Chronic viral hepatitis C: Secondary | ICD-10-CM | POA: Diagnosis not present

## 2014-06-15 DIAGNOSIS — I10 Essential (primary) hypertension: Secondary | ICD-10-CM | POA: Diagnosis not present

## 2014-06-15 DIAGNOSIS — C679 Malignant neoplasm of bladder, unspecified: Secondary | ICD-10-CM

## 2014-06-15 MED ORDER — TRAMADOL HCL 50 MG PO TABS: 50.0000 mg | ORAL_TABLET | Freq: Three times a day (TID) | ORAL | Status: DC | PRN

## 2014-06-15 NOTE — Assessment & Plan Note (Addendum)
BP up today but she did not take AM meds.  Advised pt to take meds prior to next apt and continue to monitor bp at home.  bmet.

## 2014-06-15 NOTE — Patient Instructions (Signed)
Please complete lab work prior to leaving. Follow up in 3 months.  

## 2014-06-15 NOTE — Telephone Encounter (Signed)
Left message for pt to return my call.

## 2014-06-15 NOTE — Telephone Encounter (Signed)
Spoke with pt. She states she has not seen anyone for her hepatitis in 20 yrs. Pt states she doesn't want to pursue this at this time. She is interested in pursuing alternative treatments and doesn't want to add any chemicals to her body while looking at undergoing alternative treatments for her "cancer situation". Wants to put this on hold at this time.

## 2014-06-15 NOTE — Telephone Encounter (Signed)
Please contact pt and ask if she has arranged follow up at [facility] for Hep C treatment?  If not, I recommend she arrange follow up.  Let me know if she has trouble getting back in with them.

## 2014-06-15 NOTE — Assessment & Plan Note (Signed)
Pt to arrange follow up with hep c clinic.  See phone note.

## 2014-06-15 NOTE — Assessment & Plan Note (Signed)
She declines treatment. We discussed risk of not treating.  She reports that she understands that this will be terminal for her.

## 2014-06-15 NOTE — Progress Notes (Signed)
Subjective:    Patient ID: Ruth Maldonado, female    DOB: 02/04/1956, 59 y.o.   MRN: [id number]  HPI  Ruth Maldonado is a 59 yr old female who presents today for follow  Up.  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  She initially declined cystectomy and chemo. Most recent imaging noted metastatic adenopathy. Saw Dr. Vito Berger at [facility].  Recommended PET/CT and MR brain, ct guided biopsy of local adenopathy.  The patient tells me that she is declining any treatment for her bladder cancer and that she is continuing to work on dietary changes and use of herbs.   Continues to have mild abodominal tenderness.She is requesting refill of tramadol.    Did not take AM meds. Denies CP/sob or swelling.   BP Readings from Last 3 Encounters:  06/15/14 170/90  03/16/14 138/82  12/15/13 140/88   Review of Systems See HPI  Past Medical History  Diagnosis Date  . Hypertension   . History of uterine fibroid   . Bladder tumor   . Gross hematuria   . Bladder spasm   . Wears contact lenses     History   Social History  . Marital Status: Married    Spouse Name: N/A  . Number of Children: N/A  . Years of Education: N/A   Occupational History  . Not on file.   Social History Main Topics  . Smoking status: Former Smoker -- 1.00 packs/day for 24 years    Types: Cigarettes    Quit date: 06/11/1998  . Smokeless tobacco: Never Used  . Alcohol Use: No  . Drug Use: No     Comment: previous hx of cocaine and heroine abuse- quit 18 years ago  . Sexual Activity: Not on file   Other Topics Concern  . 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    Works in [employer]   6 grand children   Married 34 years   Husband is 16 and retired   1 son- lives locally.     Past Surgical History  Procedure Laterality Date  . Abdominal hysterectomy  2000  . Colposcopy     . Transurethral resection of bladder tumor with mitomycin-c N/A 06/13/2013    Procedure: CYSTOSCOPY TRANSURETHERAL RESECTION BLADDER TUMOR BILATERAL  RETROGRADE PYLEOGRAM INSULATION OF MITOMYCON C;  Surgeon: Ardis Hughs, MD;  Location: [facility];  Service: Urology;  Laterality: N/A;  . Cystoscopy w/ retrogrades N/A 06/13/2013    Procedure: CYSTOSCOPY WITH RETROGRADE PYELOGRAM;  Surgeon: Ardis Hughs, MD;  Location: [facility];  Service: Urology;  Laterality: N/A;    Family History  Problem Relation Age of Onset  . Epilepsy Mother     temporal lobe epilepsy  . Emphysema Mother   . Cancer Mother     lung  . Hypertension Mother   . Heart attack Father     Allergies  Allergen Reactions  . Adhesive [Tape] Itching    Current Outpatient Prescriptions on File Prior to Visit  Medication Sig Dispense Refill  . Cholecalciferol (VITAMIN D3) 1000 UNITS CAPS Take 1 capsule by mouth daily.    . clobetasol ointment (TEMOVATE) 0.05 % Apply to scalp as needed  2  . hydrochlorothiazide (HYDRODIURIL) 25 MG tablet TAKE ONE TABLET BY MOUTH ONCE DAILY 30 tablet 0  . IODINE, KELP, PO TAKE 1 DROPPER PER DAY    .  losartan (COZAAR) 50 MG tablet TAKE ONE TABLET BY MOUTH ONCE DAILY 30 tablet 0  . Meth-Hyo-M Bl-Na Phos-Ph Sal (URIBEL) 118 MG CAPS Take 1 capsule by mouth 4 (four) times daily.    . milk thistle 175 MG tablet Take 175 mg by mouth daily.    [name] Kitchen OVER THE COUNTER MEDICATION TUMERIC.  1 dropper per day.    [name] Kitchen OVER THE COUNTER MEDICATION LEMON GRASS ESSENTIAL OILS.  Once a day.    [name] Kitchen OVER THE COUNTER MEDICATION FRANKINCENSE.  2 drops under tongue three times daily.    [name] Kitchen OVER THE COUNTER MEDICATION JUNIPER BERRY AND GRAPEFRUIT.  Mix together take in 1 veggie capsule daily.    [name] Kitchen OVER THE COUNTER MEDICATION GERANIUM AND CLARYSAGE.  Place in capsule and take once to twice a day.    [name] Kitchen OVER THE COUNTER MEDICATION LAVENDER AND YLANG YLANG. Apply to bottom of feet  once a day.    . vitamin E 400 UNIT capsule Take 400 Units by mouth daily.    . Vitamins/Minerals TABS Take 1 tablet by mouth daily.     No current facility-administered medications on file prior to visit.    BP 170/90 mmHg  Pulse 69  Temp(Src) 98 F (36.7 C) (Oral)  Resp 16  Ht 5' 6.5" (1.689 m)  Wt 185 lb (83.915 kg)  BMI 29.42 kg/m2  SpO2 99%       Objective:   Physical Exam  Constitutional: She is oriented to person, place, and time. She appears well-developed and well-nourished.  Cardiovascular: Normal rate, regular rhythm and normal heart sounds.   No murmur heard. Pulmonary/Chest: Effort normal and breath sounds normal. No respiratory distress. She has no wheezes.  Musculoskeletal: She exhibits no edema.  Neurological: She is alert and oriented to person, place, and time.  Psychiatric: She has a normal mood and affect. Her behavior is normal. Judgment and thought content normal.          Assessment & Plan:

## 2014-06-15 NOTE — Progress Notes (Signed)
Pre visit review using our clinic review tool, if applicable. No additional management support is needed unless otherwise documented below in the visit note. 

## 2014-07-02 ENCOUNTER — Other Ambulatory Visit: Payer: Self-pay | Admitting: Family

## 2014-07-03 NOTE — Telephone Encounter (Signed)
Faxed hardcopy for Tramadol #30 with 0 refills to [facility]

## 2014-07-14 ENCOUNTER — Telehealth: Payer: Self-pay | Admitting: Family Medicine

## 2014-07-14 NOTE — Telephone Encounter (Signed)
Received refill request from [facility] for tramadol 50mg .  Last Rx filled on 07/03/14, #30 take 1 tablet every 8 hours. Pt was previously getting these from "cancer specialist" written as; 1 tablet every 4 hours and greater quantity. Pt states she takes 1 tablet 3 times a day. Please advise.

## 2014-07-15 NOTE — Telephone Encounter (Signed)
Notified pt and faxed Rx.

## 2014-07-15 NOTE — Telephone Encounter (Signed)
Pt is checking the status of medication refill. Please call [phone]

## 2014-07-15 NOTE — Telephone Encounter (Signed)
See rx. 

## 2014-08-01 ENCOUNTER — Other Ambulatory Visit: Payer: Self-pay | Admitting: Family

## 2014-08-12 ENCOUNTER — Other Ambulatory Visit: Payer: Self-pay | Admitting: Family

## 2014-08-14 NOTE — Telephone Encounter (Signed)
Rx faxed to pharmacy  

## 2014-08-14 NOTE — Telephone Encounter (Signed)
Pt has f/u 09/21/14. Last rx printed 07/15/14.  Rx printed and forwarded to Provider for signature:        Medication Refill     [facility] Rx Refill  2 days ago   (4:59 PM)    [facility] [phone]   Surescripts Out Interface  2 days ago   (4:59 PM)         New medications from outside sources are available for reconciliation.       Requested Medications     Medication name:  Name from pharmacy:  traMADol (ULTRAM) 50 MG tablet TRAMADOL HCL 50MG  TAB    Sig: TAKE ONE TABLET BY MOUTH EVERY 8 HOURS AS NEEDED    Dispense: 90 tablet   Refills: 0   Start: 08/13/2014   Class: Normal    Non-formulary    Requested on: 07/15/2014    Originally ordered on: 06/15/2014 07/15/2014 Order History and Details      Call Documentation     No notes of this type exist for this encounter.     Contacts       Type Contact Phone    08/12/2014 04:59 PM Interface (Incoming) [facility] [phone]      Allergies as of 08/12/2014  Review Complete On: 06/15/2014 By: Ronny Flurry, CMA    Allergen Noted Reaction Type Reactions    Adhesive [Tape] 06/10/2013  Intolerance Itching      Patient Flags     Flag Type Author Status Filed    General Amy Asa Active 06/16/2014 3:40 PM    06/15/14 controlled substance signed, no uds sample given,        General Rodena Piety Active 07/14/2013 10:17 AM    PATIENT HAS NOT BEEN OUT OF THE COUNTRY IN THE LAST 21 DAYS NO CONTACT          Future Appointments       Provider [place]    09/21/2014 8:00 AM Debbrah Alar, NP [place] [facility]       Outstanding Procedures      Open Future Orders       Priority Expected Expires Ordered    DG Bone Density Routine  11/01/2014 09/01/2013

## 2014-09-14 ENCOUNTER — Ambulatory Visit: Payer: BLUE CROSS/BLUE SHIELD | Admitting: Family

## 2014-09-15 ENCOUNTER — Telehealth: Payer: Self-pay | Admitting: Family

## 2014-09-15 DIAGNOSIS — I10 Essential (primary) hypertension: Secondary | ICD-10-CM

## 2014-09-15 MED ORDER — TRAMADOL HCL 50 MG PO TABS: 50.0000 mg | ORAL_TABLET | Freq: Three times a day (TID) | ORAL | Status: DC | PRN

## 2014-09-15 NOTE — Telephone Encounter (Signed)
Spoke with pt. Pt was supposed to have completed bmet at last visit in March. Scheduled lab appt for 09/18/14 at 8:15am to complete. Pt requests refills of BP meds and tramadol. Advised pt that Refills were sent on HCTZ and Losartan on 08/04/14 and should have refills; pt will contact pharmacy.  Tramadol Rx printed and forwarded to covering MD, Etter Sjogren) for signature.  CSC signed 06/15/14, no UDS on file and will obtain at upcoming appt with PCP.

## 2014-09-15 NOTE — Telephone Encounter (Signed)
Tramadol rx signed and faxed to pharmacy at 11:29 am.  Notified pt.

## 2014-09-15 NOTE — Telephone Encounter (Signed)
Caller name:Ruth Maldonado Relationship to patient:self Can be reached:[phone] Pharmacy:  Reason for call:She throught her appt was for yesterday came to the appointment and was told [name] was not in  No one told her [name] would not be in and they did not call her to cancel the appointment.  She is now scheduled to see [name] on the 20th.  She was to have labs done after her last visit  There are no orders in the system for her labs.  She would like to have them done this week so [name] will have the results on the 20th  Please the patient.  She wants rx refilled as well

## 2014-09-18 ENCOUNTER — Other Ambulatory Visit (INDEPENDENT_AMBULATORY_CARE_PROVIDER_SITE_OTHER): Payer: BLUE CROSS/BLUE SHIELD

## 2014-09-18 DIAGNOSIS — I10 Essential (primary) hypertension: Secondary | ICD-10-CM | POA: Diagnosis not present

## 2014-09-18 LAB — BASIC METABOLIC PANEL
BUN: 18 mg/dL (ref 6–23)
CALCIUM: 9.4 mg/dL (ref 8.4–10.5)
CO2: 26 meq/L (ref 19–32)
CREATININE: 0.88 mg/dL (ref 0.40–1.20)
Chloride: 101 mEq/L (ref 96–112)
GFR: 83.96 mL/min (ref >60.00)
GLUCOSE: 122 mg/dL — AB (ref 70–99)
Potassium: 3.6 mEq/L (ref 3.5–5.1)
Sodium: 134 mEq/L — ABNORMAL LOW (ref 135–145)

## 2014-09-21 ENCOUNTER — Encounter: Payer: Self-pay | Admitting: Family

## 2014-09-21 ENCOUNTER — Ambulatory Visit (INDEPENDENT_AMBULATORY_CARE_PROVIDER_SITE_OTHER): Payer: BLUE CROSS/BLUE SHIELD | Admitting: Family

## 2014-09-21 VITALS — BP 130/82 | HR 82 | Temp 98.3°F | Resp 16 | Ht 66.5 in | Wt 168.6 lb

## 2014-09-21 DIAGNOSIS — I10 Essential (primary) hypertension: Secondary | ICD-10-CM

## 2014-09-21 DIAGNOSIS — R739 Hyperglycemia, unspecified: Secondary | ICD-10-CM

## 2014-09-21 DIAGNOSIS — C679 Malignant neoplasm of bladder, unspecified: Secondary | ICD-10-CM

## 2014-09-21 DIAGNOSIS — M25512 Pain in left shoulder: Secondary | ICD-10-CM | POA: Diagnosis not present

## 2014-09-21 DIAGNOSIS — B182 Chronic viral hepatitis C: Secondary | ICD-10-CM

## 2014-09-21 LAB — HEMOGLOBIN A1C: Hgb A1c MFr Bld: 5.8 % (ref 4.6–6.5)

## 2014-09-21 NOTE — Progress Notes (Signed)
Subjective:    Patient ID: Ruth Maldonado, female    DOB: 02-20-1956, 59 y.o.   MRN: [id number]  HPI  [name] is a 59 yr old female who presents today for follow up.  1) HTN-  Patient is currently maintained on the following medications for blood pressure: hctz Patient reports good compliance with blood pressure medications. Patient denies chest pain, shortness of breath or swelling. Last 3 blood pressure readings in our office are as follows: BP Readings from Last 3 Encounters:  09/21/14 130/82  06/15/14 170/90  03/16/14 138/82   2) Bladder cancer- she is using prn tramadol.  She notes that she is having significant bladder pain if she stands for a prolonged period of time.  This is an issue because she works as a Emergency planning/management officer.  She reports that as a result of the pain she plans to cut down on her work hours. She was most recently evaluated at [facility] by a multidisciplinary team. She has had several oncology/urology opinions.  She is now considering pursuing treatment for her cancer.   3) Hep C- reports that hep C was diagnosed 20 years ago.  Declines treatment currently.   4) L shoulder pain- works as a Probation officer. Would like further evaluation of her shoulder pain.   Review of Systems     see HPI  Past Medical History  Diagnosis Date  . Hypertension   . History of uterine fibroid   . Bladder tumor   . Gross hematuria   . Bladder spasm   . Wears contact lenses     History   Social History  . Marital Status: Married    Spouse Name: N/A  . Number of Children: N/A  . Years of Education: N/A   Occupational History  . Not on file.   Social History Main Topics  . Smoking status: Former Smoker -- 1.00 packs/day for 24 years    Types: Cigarettes    Quit date: 06/11/1998  . Smokeless tobacco: Never Used  . Alcohol Use: No  . Drug Use: No     Comment: previous hx of cocaine and heroine abuse- quit 18 years ago  . Sexual Activity: Not on file   Other  Topics Concern  . 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    Works in [employer]   6 grand children   Married 4 years   Husband is 62 and retired   1 son- lives locally.     Past Surgical History  Procedure Laterality Date  . Abdominal hysterectomy  2000  . Colposcopy    . Transurethral resection of bladder tumor with mitomycin-c N/A 06/13/2013    Procedure: CYSTOSCOPY TRANSURETHERAL RESECTION BLADDER TUMOR BILATERAL  RETROGRADE PYLEOGRAM INSULATION OF MITOMYCON C;  Surgeon: Ardis Hughs, MD;  Location: [facility];  Service: Urology;  Laterality: N/A;  . Cystoscopy w/ retrogrades N/A 06/13/2013    Procedure: CYSTOSCOPY WITH RETROGRADE PYELOGRAM;  Surgeon: Ardis Hughs, MD;  Location: [facility];  Service: Urology;  Laterality: N/A;    Family History  Problem Relation Age of Onset  . Epilepsy Mother     temporal lobe epilepsy  . Emphysema Mother   . Cancer Mother     lung  . Hypertension Mother   . Heart attack Father     Allergies  Allergen Reactions  . Adhesive [Tape] Itching  Current Outpatient Prescriptions on File Prior to Visit  Medication Sig Dispense Refill  . Cholecalciferol (VITAMIN D3) 1000 UNITS CAPS Take 1 capsule by mouth daily.    . clobetasol ointment (TEMOVATE) 0.05 % Apply to scalp as needed  2  . hydrochlorothiazide (HYDRODIURIL) 25 MG tablet TAKE ONE TABLET BY MOUTH ONCE DAILY 30 tablet 5  . IODINE, KELP, PO TAKE 1 DROPPER PER DAY    . losartan (COZAAR) 50 MG tablet TAKE ONE TABLET BY MOUTH ONCE DAILY 30 tablet 5  . milk thistle 175 MG tablet Take 175 mg by mouth daily as needed.     [name] Kitchen OVER THE COUNTER MEDICATION TUMERIC.  1 dropper per day.    [name] Kitchen OVER THE COUNTER MEDICATION LEMON GRASS ESSENTIAL OILS.  Once a day.    [name] Kitchen OVER THE COUNTER MEDICATION FRANKINCENSE.  2 drops under tongue three times daily.    [name] Kitchen OVER THE COUNTER MEDICATION  JUNIPER BERRY AND GRAPEFRUIT.  Mix together take in 1 veggie capsule daily.    [name] Kitchen OVER THE COUNTER MEDICATION GERANIUM AND CLARYSAGE.  Place in capsule and take once to twice a day.    [name] Kitchen OVER THE COUNTER MEDICATION LAVENDER AND YLANG YLANG. Apply to bottom of feet once a day.    . vitamin E 400 UNIT capsule Take 400 Units by mouth daily.    . Vitamins/Minerals TABS Take 1 tablet by mouth daily.     No current facility-administered medications on file prior to visit.    BP 130/82 mmHg  Pulse 82  Temp(Src) 98.3 F (36.8 C) (Oral)  Resp 16  Ht 5' 6.5" (1.689 m)  Wt 168 lb 9.6 oz (76.476 kg)  BMI 26.81 kg/m2  SpO2 99%     Objective:   Physical Exam  Constitutional: She appears well-developed and well-nourished.  Cardiovascular: Normal rate, regular rhythm and normal heart sounds.   No murmur heard. Pulmonary/Chest: Effort normal and breath sounds normal. No respiratory distress. She has no wheezes.  Abdominal: Soft.    Musculoskeletal:  L shoulder- no swelling  Psychiatric: She has a normal mood and affect. Her behavior is normal. Judgment and thought content normal.          Assessment & Plan:

## 2014-09-21 NOTE — Patient Instructions (Addendum)
Complete lab work prior to leaving (UDS) Please contact Dr. Lossie Faes to arrange follow up. [phone]  Limit your use of ibuprofen. You may used tramadol 1-2 tabs every 6 hours as needed.  You will be contacted about your referral to Dr. Ned Clines (sports medicine) for your shoulder pain.  Please schedule a follow up appointment in 3 months.

## 2014-09-21 NOTE — Progress Notes (Signed)
Pre visit review using our clinic review tool, if applicable. No additional management support is needed unless otherwise documented below in the visit note. 

## 2014-09-22 ENCOUNTER — Ambulatory Visit: Payer: BLUE CROSS/BLUE SHIELD | Admitting: Family Medicine

## 2014-09-22 ENCOUNTER — Encounter: Payer: Self-pay | Admitting: Family Medicine

## 2014-09-22 ENCOUNTER — Ambulatory Visit (INDEPENDENT_AMBULATORY_CARE_PROVIDER_SITE_OTHER): Payer: BLUE CROSS/BLUE SHIELD | Admitting: Family Medicine

## 2014-09-22 VITALS — BP 153/83 | HR 74 | Ht 66.0 in | Wt 170.0 lb

## 2014-09-22 DIAGNOSIS — M25512 Pain in left shoulder: Secondary | ICD-10-CM | POA: Diagnosis not present

## 2014-09-22 MED ORDER — DICLOFENAC SODIUM 75 MG PO TBEC: 75.0000 mg | DELAYED_RELEASE_TABLET | Freq: Two times a day (BID) | ORAL | Status: DC

## 2014-09-22 NOTE — Patient Instructions (Signed)
You have rotator cuff impingement Try to avoid painful activities (overhead activities, lifting with extended arm) as much as possible. Diclofenac twice a day with food for pain and inflammation. Can take tylenol in addition to this. Subacromial injection may be beneficial to help with pain and to decrease inflammation. Start physical therapy with transition to home exercise program. Do home exercise program with theraband and scapular stabilization exercises daily - these are very important for long term relief even if an injection was given. If not improving at follow-up we will consider further imaging, injection, and/or nitro patches.

## 2014-09-24 DIAGNOSIS — R739 Hyperglycemia, unspecified: Secondary | ICD-10-CM | POA: Insufficient documentation

## 2014-09-24 DIAGNOSIS — M25512 Pain in left shoulder: Secondary | ICD-10-CM | POA: Insufficient documentation

## 2014-09-24 NOTE — Assessment & Plan Note (Signed)
Will obtain A1C.   

## 2014-09-24 NOTE — Assessment & Plan Note (Addendum)
Long discussion with patient.  We discussed that her worsening bladder pain is due to her progressive cancer and the the longer she waits, the less effective her treatment options will be. Discussed with her that pain will not improve without treatment and is likely to worsen. I told her ok to take 2 tabs of tramadol Q6 hr prn.  Urged her to arrange follow up with Dr. Lossie Faes (urology) to arrange follow up. [phone]. 25 + min spent with pt today.  >50% of this time was spent counseling pt on bladder cancer and treatment options.

## 2014-09-24 NOTE — Assessment & Plan Note (Signed)
Refer to sports med for further evaluation.

## 2014-09-24 NOTE — Progress Notes (Signed)
PCP and referred by: Nance Pear., NP  Subjective:   HPI: Patient is a 59 y.o. female here for left shoulder pain.  Patient denies known injury. She works as a Theme park manager though and has developed L > R shoulder pain for the past month. Worse at night - 7/10 level. Difficulty sleeping. Tried ibuprofen 800mg . No prior injuries. Is right handed.  Past Medical History  Diagnosis Date  . Hypertension   . History of uterine fibroid   . Bladder tumor   . Gross hematuria   . Bladder spasm   . Wears contact lenses     Current Outpatient Prescriptions on File Prior to Visit  Medication Sig Dispense Refill  . Cholecalciferol (VITAMIN D3) 1000 UNITS CAPS Take 1 capsule by mouth daily.    . clobetasol ointment (TEMOVATE) 0.05 % Apply to scalp as needed  2  . hydrochlorothiazide (HYDRODIURIL) 25 MG tablet TAKE ONE TABLET BY MOUTH ONCE DAILY 30 tablet 5  . IODINE, KELP, PO TAKE 1 DROPPER PER DAY    . losartan (COZAAR) 50 MG tablet TAKE ONE TABLET BY MOUTH ONCE DAILY 30 tablet 5  . milk thistle 175 MG tablet Take 175 mg by mouth daily as needed.     [name] Kitchen OVER THE COUNTER MEDICATION TUMERIC.  1 dropper per day.    [name] Kitchen OVER THE COUNTER MEDICATION LEMON GRASS ESSENTIAL OILS.  Once a day.    [name] Kitchen OVER THE COUNTER MEDICATION FRANKINCENSE.  2 drops under tongue three times daily.    [name] Kitchen OVER THE COUNTER MEDICATION JUNIPER BERRY AND GRAPEFRUIT.  Mix together take in 1 veggie capsule daily.    [name] Kitchen OVER THE COUNTER MEDICATION GERANIUM AND CLARYSAGE.  Place in capsule and take once to twice a day.    [name] Kitchen OVER THE COUNTER MEDICATION LAVENDER AND YLANG YLANG. Apply to bottom of feet once a day.    . traMADol (ULTRAM) 50 MG tablet 1-2 tabs every 6 hours as needed    . vitamin E 400 UNIT capsule Take 400 Units by mouth daily.    . Vitamins/Minerals TABS Take 1 tablet by mouth daily.     No current facility-administered medications on file prior to visit.    Past Surgical History  Procedure Laterality  Date  . Abdominal hysterectomy  2000  . Colposcopy    . Transurethral resection of bladder tumor with mitomycin-c N/A 06/13/2013    Procedure: CYSTOSCOPY TRANSURETHERAL RESECTION BLADDER TUMOR BILATERAL  RETROGRADE PYLEOGRAM INSULATION OF MITOMYCON C;  Surgeon: Ardis Hughs, MD;  [facility];  Service: Urology;  Laterality: N/A;  . Cystoscopy w/ retrogrades N/A 06/13/2013    Procedure: CYSTOSCOPY WITH RETROGRADE PYELOGRAM;  Surgeon: Ardis Hughs, MD;  Location: [facility];  Service: Urology;  Laterality: N/A;    Allergies  Allergen Reactions  . Adhesive [Tape] Itching    History   Social History  . Marital Status: Married    Spouse Name: N/A  . Number of Children: N/A  . Years of Education: N/A   Occupational History  . Not on file.   Social History Main Topics  . Smoking status: Former Smoker -- 1.00 packs/day for 24 years    Types: Cigarettes    Quit date: 06/11/1998  . Smokeless tobacco: Never Used  . Alcohol Use: No  . Drug Use: No     Comment: previous hx of cocaine and heroine abuse- quit 18 years ago  . Sexual Activity: Not on file   Other  Topics Concern  . 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    Works in [employer]   6 grand children   Married 60 years   Husband is 56 and retired   1 son- lives locally.     Family History  Problem Relation Age of Onset  . Epilepsy Mother     temporal lobe epilepsy  . Emphysema Mother   . Cancer Mother     lung  . Hypertension Mother   . Heart attack Father     BP 153/83 mmHg  Pulse 74  Ht 5\' 6"  (1.676 m)  Wt 170 lb (77.111 kg)  BMI 27.45 kg/m2  Review of Systems: See HPI above.    Objective:  Physical Exam:  Gen: NAD  Left shoulder: No swelling, ecchymoses.  No gross deformity. No TTP. FROM with painful arc.[name] Kitchen Positive Hawkins, Neers. Negative Speeds, Yergasons. Strength 5/5  with empty can and resisted internal/external rotation.  Pain empty can. Negative apprehension. NV intact distally.    Assessment & Plan:  1. Left shoulder pain - 2/2 rotator cuff impingement.  Shown home exercises to do daily.  Diclofenac twice a day with food.  Start physical therapy as well.  Consider further imaging, injection, nitro patches if not improving at f/u in 5-6 weeks.

## 2014-09-24 NOTE — Assessment & Plan Note (Signed)
2/2 rotator cuff impingement.  Shown home exercises to do daily.  Diclofenac twice a day with food.  Start physical therapy as well.  Consider further imaging, injection, nitro patches if not improving at f/u in 5-6 weeks.

## 2014-09-24 NOTE — Assessment & Plan Note (Signed)
Declines treatment

## 2014-09-24 NOTE — Assessment & Plan Note (Signed)
BP is stable on current meds. Continue same,

## 2014-09-25 ENCOUNTER — Other Ambulatory Visit: Payer: Self-pay | Admitting: *Deleted

## 2014-09-25 DIAGNOSIS — M25512 Pain in left shoulder: Secondary | ICD-10-CM

## 2014-10-01 ENCOUNTER — Other Ambulatory Visit: Payer: Self-pay | Admitting: Family

## 2014-10-01 MED ORDER — HYOSCYAMINE SULFATE ER 0.375 MG PO TB12: 0.3750 mg | ORAL_TABLET | Freq: Two times a day (BID) | ORAL | Status: DC

## 2014-10-01 MED ORDER — TRAMADOL HCL 50 MG PO TABS: 100.0000 mg | ORAL_TABLET | Freq: Four times a day (QID) | ORAL | Status: DC | PRN

## 2014-10-01 NOTE — Telephone Encounter (Signed)
Patient notified and Rx faxed to pharmacy.  

## 2014-10-01 NOTE — Telephone Encounter (Signed)
Refill provided for tramadol.  I also sent rx or levbid which she can take twice a day to help with bladder spasm.

## 2014-10-01 NOTE — Telephone Encounter (Signed)
Caller name: Skai Relation to pt: Call back number: [phone] Pharmacy: [facility] on [address] in [place]  Reason for call:   Requesting tramadol refill. She states that she has decreased ibuprofen and is taking tramadol two times daily.

## 2014-10-01 NOTE — Telephone Encounter (Signed)
Patient does not want to take this medication because she does not want to take narcotics.  She states she works during the day and she does not want to be drowsy.  Is there anything else she could take?

## 2014-10-01 NOTE — Telephone Encounter (Signed)
Please contact patient and let her know that if she is needing to take that many tabs then we really should switch her to something stronger.   I would advise hydrocodone as pended below instead.

## 2014-10-01 NOTE — Telephone Encounter (Signed)
Patient requesting Tramadol refill:   Per note below she states she is taking two times daily- clarified with patient and she states she is taking 2 tablets every 6 hours.   Last refilled 09/15/14 for 90 with 0 LOV- 09/21/14 FYI states UDS given 09/21/14, but cannot find result Contract signed -06/15/14 Please advise.

## 2014-10-09 ENCOUNTER — Encounter: Payer: Self-pay | Admitting: Rehabilitation

## 2014-10-09 ENCOUNTER — Ambulatory Visit: Payer: BLUE CROSS/BLUE SHIELD | Attending: Family Medicine | Admitting: Rehabilitation

## 2014-10-09 DIAGNOSIS — M25512 Pain in left shoulder: Secondary | ICD-10-CM | POA: Diagnosis not present

## 2014-10-09 NOTE — Therapy (Addendum)
[facility] [address]  [place], [place], [address] Phone: [phone]   Fax:  [phone]  Physical Therapy Evaluation  Patient Details  Name: Ruth Maldonado MRN: [id number] Date of Birth: 17-Oct-1955 Referring Provider:  Dene Gentry, MD  Encounter Date: 10/09/2014      PT End of Session - 10/09/14 1159    Visit Number 1   Number of Visits 8   Date for PT Re-Evaluation 11/06/14   PT Start Time 0939   PT Stop Time 1015   PT Time Calculation (min) 36 min   Activity Tolerance Patient tolerated treatment well      Past Medical History  Diagnosis Date  . Hypertension   . History of uterine fibroid   . Bladder tumor   . Gross hematuria   . Bladder spasm   . Wears contact lenses     Past Surgical History  Procedure Laterality Date  . Abdominal hysterectomy  2000  . Colposcopy    . Transurethral resection of bladder tumor with mitomycin-c N/A 06/13/2013    Procedure: CYSTOSCOPY TRANSURETHERAL RESECTION BLADDER TUMOR BILATERAL  RETROGRADE PYLEOGRAM INSULATION OF MITOMYCON C;  Surgeon: Ardis Hughs, MD;  Location: [facility];  Service: Urology;  Laterality: N/A;  . Cystoscopy w/ retrogrades N/A 06/13/2013    Procedure: CYSTOSCOPY WITH RETROGRADE PYELOGRAM;  Surgeon: Ardis Hughs, MD;  Location: [facility];  Service: Urology;  Laterality: N/A;    There were no vitals filed for this visit.  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  It is very painful lying down at night. Has been about 1 month.  No cause or shoulder issues in the past.  Is now only working 8 hours per day; used to do 14 hours per day.  Reports pain increases with reaching behind  but  never a sharp pain. R handed.  Nothing she is unable to do.  Given diclofenac 2x per day which does not help. Also given a red band to perform  IR/ER but not performing them .  No numbness or tingling.  pain is more posterior deltoid.    Diagnostic tests none   Patient Stated Goals stop the pain   Currently in Pain? Yes   Pain Score 5   up to 10/10 at night    Pain Location Shoulder   Pain Orientation Left   Pain Descriptors / Indicators Aching   Pain Onset More than a month ago   Pain Frequency Constant   Aggravating Factors  use, lying in bed   Pain Relieving Factors rest            [facility] PT Assessment - 10/09/14 0001    Assessment   Medical Diagnosis L shoulder impingement   Onset Date/Surgical Date 09/09/14   Hand Dominance Right   Prior Therapy no   Precautions   Precautions None   Restrictions   Weight Bearing Restrictions No   Balance Screen   Has the patient fallen in the past 6 months No   Prior Function   Level of Independence Independent   Observation/Other Assessments   Focus on Therapeutic Outcomes (FOTO)  47% limited   Posture/Postural  Control   Posture Comments L shoulder more protracted/forward   ROM / Strength   AROM / PROM / Strength AROM;PROM;Strength   AROM   Overall AROM Comments All WNL by patient WNL and without increased pain.  Pain increases only with abduction overpressure and behind the back OP into horizontal adduction   PROM   PROM Assessment Site Shoulder   Right/Left Shoulder Left   Left Shoulder Flexion 165 Degrees  with pain EOR   Left Shoulder ABduction 165 Degrees  with pain EOR   Left Shoulder Internal Rotation 75 Degrees   Left Shoulder External Rotation 90 Degrees   Strength   Overall Strength Comments same on R   Strength Assessment Site Shoulder   Right/Left Shoulder Left   Left Shoulder Flexion 4-/5   Left Shoulder Extension 4/5   Left Shoulder ABduction 4-/5   Left Shoulder Internal Rotation 4/5   Left Shoulder  External Rotation 4/5   Palpation   Palpation comment +2 ttp supraspinatus insertion and muscle belly; +1 posterior deltoid.   Muscle guarding during [place] accessory testing in general limiting value but decreased joint play overall   Special Tests    Special Tests Rotator Cuff Impingement   Rotator Cuff Impingment tests Painful Arc of Motion   Painful Arc of Motion   Findings Negative   Side Left      Eval completed Discussed possible iontophoresis with patient to consider it as she does not usually like taking medication. Reviewed HEP with performance of all x 15;  RTB shoulder IR/ER/ROW cueing as needed                     PT Education - 10/09/14 1159    Education provided Yes   Education Details POC, HEP   Person(s) Educated Patient   Methods Explanation;Demonstration;Handout   Comprehension Verbalized understanding;Returned demonstration          PT Short Term Goals - 10/09/14 1208    PT SHORT TERM GOAL #1   Title Pt will be independent with her initial HEP   Time 1   Period Weeks   Status New           PT Long Term Goals - 10/09/14 1208    PT LONG TERM GOAL #1   Title pt will be independent with her final HEP    Time 4   Period Weeks   Status New   PT LONG TERM GOAL #2   Title pt will demonstrate no pain with shoulder abduction overpressure   Time 4   Period Weeks   Status New   PT LONG TERM GOAL #3   Title pt will report pain as intermittent only   Time 4   Period Weeks   Status New   PT LONG TERM GOAL #4   Title pt will decrease night pain to 4/10 or less   Time 4   Period Weeks   Status New               Plan - 10/09/14 1200    Clinical Impression Statement Pt presents with mild shoulder impingment with 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  Most likley attributable to global shoulder weakness/decreased endurance and L shoulder protracted position compared to the R.     Pt will benefit from skilled  therapeutic intervention in order to improve on the following deficits Pain;Decreased strength   Rehab Potential Excellent   PT Frequency 2x /  week   PT Duration 4 weeks   PT Treatment/Interventions Iontophoresis 4mg /ml Dexamethasone;Cryotherapy;Vasopneumatic Device;Ultrasound;Therapeutic exercise;Neuromuscular re-education;Manual techniques;Taping   PT Next Visit Plan continue impingment POC; RC and postural strengthening; taping/ionto?   Consulted and Agree with Plan of Care Patient         Problem List Patient Active Problem List   Diagnosis Date Noted  . Left shoulder pain 09/24/2014  . Hyperglycemia 09/24/2014  . Hepatitis C 09/03/2013  . Routine general medical examination at a health care facility 09/01/2013  . HTN (hypertension) 08/04/2013  . Bladder cancer 07/02/2013  . LBP (low back pain) 10/23/2012    Stark Bray, DPT, CMP 10/09/2014, 12:12 PM  [facility] [address]  [place], [place], [address] Phone: [phone]   Fax:  [phone]    PHYSICAL THERAPY DISCHARGE SUMMARY  Visits from Start of Care: 1  Current functional level related to goals / functional outcomes:  Refer to above evaluation as patient did not return for any visits after the initial evaluation.   Remaining deficits:  See above   Education / Equipment:  Initial HEP provided  Plan: Patient agrees to discharge.  Patient goals were not met. Patient is being discharged due to the patient's request.  ?????       Percival Spanish, PT, MPT 10/23/2014, 8:07 AM  [facility] [address]  [place], [place], [address] Phone: [phone]   Fax:  [phone]

## 2014-10-09 NOTE — Patient Instructions (Signed)
IR/ER/Row with red theraband 2x15 everyday; given handout

## 2014-10-12 ENCOUNTER — Ambulatory Visit: Payer: BLUE CROSS/BLUE SHIELD | Admitting: Physical Therapy

## 2014-10-13 ENCOUNTER — Ambulatory Visit (INDEPENDENT_AMBULATORY_CARE_PROVIDER_SITE_OTHER): Payer: Medicaid Other | Admitting: Medical

## 2014-10-13 ENCOUNTER — Encounter: Payer: Self-pay | Admitting: Medical

## 2014-10-13 VITALS — BP 140/102 | HR 107 | Temp 97.9°F | Ht 66.0 in | Wt 166.2 lb

## 2014-10-13 DIAGNOSIS — R82998 Other abnormal findings in urine: Secondary | ICD-10-CM

## 2014-10-13 DIAGNOSIS — I10 Essential (primary) hypertension: Secondary | ICD-10-CM | POA: Diagnosis not present

## 2014-10-13 DIAGNOSIS — R809 Proteinuria, unspecified: Secondary | ICD-10-CM | POA: Diagnosis not present

## 2014-10-13 DIAGNOSIS — M549 Dorsalgia, unspecified: Secondary | ICD-10-CM

## 2014-10-13 DIAGNOSIS — N390 Urinary tract infection, site not specified: Secondary | ICD-10-CM

## 2014-10-13 DIAGNOSIS — N200 Calculus of kidney: Secondary | ICD-10-CM

## 2014-10-13 DIAGNOSIS — R319 Hematuria, unspecified: Secondary | ICD-10-CM

## 2014-10-13 LAB — COMPREHENSIVE METABOLIC PANEL
ALT: 31 U/L (ref 0–35)
AST: 31 U/L (ref 0–37)
Albumin: 3.9 g/dL (ref 3.5–5.2)
Alkaline Phosphatase: 93 U/L (ref 39–117)
BUN: 12 mg/dL (ref 6–23)
CALCIUM: 9.7 mg/dL (ref 8.4–10.5)
CO2: 27 mEq/L (ref 19–32)
CREATININE: 0.91 mg/dL (ref 0.40–1.20)
Chloride: 106 mEq/L (ref 96–112)
GFR: 80.76 mL/min (ref >60.00)
GLUCOSE: 89 mg/dL (ref 70–99)
Potassium: 3.9 mEq/L (ref 3.5–5.1)
Sodium: 141 mEq/L (ref 135–145)
Total Bilirubin: 0.4 mg/dL (ref 0.2–1.2)
Total Protein: 7.8 g/dL (ref 6.0–8.3)

## 2014-10-13 LAB — POCT URINALYSIS DIPSTICK
Bilirubin, UA: POSITIVE
GLUCOSE UA: NEGATIVE
KETONES UA: 5
Nitrite, UA: NEGATIVE
PH UA: 7.5
PROTEIN UA: 2000
Spec Grav, UA: 1.02
Urobilinogen, UA: 0.2

## 2014-10-13 MED ORDER — CIPROFLOXACIN HCL 500 MG PO TABS: 500.0000 mg | ORAL_TABLET | Freq: Two times a day (BID) | ORAL | Status: DC

## 2014-10-13 MED ORDER — HYDROCODONE-ACETAMINOPHEN 5-325 MG PO TABS: 1.0000 | ORAL_TABLET | Freq: Four times a day (QID) | ORAL | Status: DC | PRN

## 2014-10-13 NOTE — Progress Notes (Signed)
Subjective:    Patient ID: Ruth Maldonado, female    DOB: 1955-12-03, 59 y.o.   MRN: [id number]  HPI  Pt has bladder cancer. Pt is going to [place] this Friday MRI and petscan.  Pt states she had some severe recently had some severe left side lower back pain last 3 days. She states when she wiped last night  with cotton swab after urinating she picked up some debri like a stone. She actually bring in stones in a bag.  Pt denies  Hx of kidney stone. Severe left back pain and some suprapubic pain last 3 days. She states passed stones yesterday but still in pain.  Pt bp is high today. Usually controlled and she did losartan and hctz for her bp. Pt did not take either of these meds today. No cardiac or neurologic signs or symptoms.  Pt has been on tramadol for bladder region pain in the past but with the left side renal colic pain it was not adequate.  Pt has leukocytes in urine. Some pain on urination at end of stream. No fevers, no obvious chills or sweats. No history of chronic frequent uti.  On Monday will see oncologist or urologist.      Review of Systems  Constitutional: Negative for fever, chills and fatigue.  Cardiovascular: Negative for chest pain and palpitations.  Gastrointestinal: Negative for nausea, vomiting, abdominal pain, diarrhea, constipation, blood in stool, abdominal distention and anal bleeding.       Faint suprapubic pressure.  Genitourinary: Positive for dysuria. Negative for frequency, vaginal bleeding, vaginal pain and menstrual problem.  Musculoskeletal: Positive for back pain.  Neurological: Negative for dizziness, weakness and headaches.  Hematological: Negative for adenopathy. Does not bruise/bleed easily.    Past Medical History  Diagnosis Date  . Hypertension   . History of uterine fibroid   . Bladder tumor   . Gross hematuria   . Bladder spasm   . Wears contact lenses     History   Social History  . Marital Status: Married   Spouse Name: N/A  . Number of Children: N/A  . Years of Education: N/A   Occupational History  . Not on file.   Social History Main Topics  . Smoking status: Former Smoker -- 1.00 packs/day for 24 years    Types: Cigarettes    Quit date: 06/11/1998  . Smokeless tobacco: Never Used  . Alcohol Use: No  . Drug Use: No     Comment: previous hx of cocaine and heroine abuse- quit 18 years ago  . Sexual Activity: Not on file   Other Topics Concern  . 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    Works in [employer]   6 grand children   Married 74 years   Husband is 33 and retired   1 son- lives locally.     Past Surgical History  Procedure Laterality Date  . Abdominal hysterectomy  2000  . Colposcopy    . Transurethral resection of bladder tumor with mitomycin-c N/A 06/13/2013    Procedure: CYSTOSCOPY TRANSURETHERAL RESECTION BLADDER TUMOR BILATERAL  RETROGRADE PYLEOGRAM INSULATION OF MITOMYCON C;  Surgeon: Ardis Hughs, MD;  Location: [facility];  Service: Urology;  Laterality: N/A;  . Cystoscopy w/ retrogrades N/A 06/13/2013    Procedure: CYSTOSCOPY WITH RETROGRADE PYELOGRAM;  Surgeon: Ardis Hughs, MD;  Location: [facility];  Service: Urology;  Laterality: N/A;    Family History  Problem Relation Age of Onset  . Epilepsy Mother     temporal lobe epilepsy  . Emphysema Mother   . Cancer Mother     lung  . Hypertension Mother   . Heart attack Father     Allergies  Allergen Reactions  . Adhesive [Tape] Itching    Current Outpatient Prescriptions on File Prior to Visit  Medication Sig Dispense Refill  . Cholecalciferol (VITAMIN D3) 1000 UNITS CAPS Take 1 capsule by mouth daily.    . clobetasol ointment (TEMOVATE) 0.05 % Apply to scalp as needed  2  . diclofenac (VOLTAREN) 75 MG EC tablet Take 1 tablet (75 mg total) by mouth 2 (two) times daily. 60 tablet 1    . hydrochlorothiazide (HYDRODIURIL) 25 MG tablet TAKE ONE TABLET BY MOUTH ONCE DAILY 30 tablet 5  . hyoscyamine (LEVBID) 0.375 MG 12 hr tablet Take 1 tablet (0.375 mg total) by mouth 2 (two) times daily. 60 tablet 2  . IODINE, KELP, PO TAKE 1 DROPPER PER DAY    . losartan (COZAAR) 50 MG tablet TAKE ONE TABLET BY MOUTH ONCE DAILY 30 tablet 5  . milk thistle 175 MG tablet Take 175 mg by mouth daily as needed.     [name] Kitchen OVER THE COUNTER MEDICATION TUMERIC.  1 dropper per day.    [name] Kitchen OVER THE COUNTER MEDICATION LEMON GRASS ESSENTIAL OILS.  Once a day.    [name] Kitchen OVER THE COUNTER MEDICATION FRANKINCENSE.  2 drops under tongue three times daily.    [name] Kitchen OVER THE COUNTER MEDICATION JUNIPER BERRY AND GRAPEFRUIT.  Mix together take in 1 veggie capsule daily.    [name] Kitchen OVER THE COUNTER MEDICATION GERANIUM AND CLARYSAGE.  Place in capsule and take once to twice a day.    [name] Kitchen OVER THE COUNTER MEDICATION LAVENDER AND YLANG YLANG. Apply to bottom of feet once a day.    . traMADol (ULTRAM) 50 MG tablet Take 2 tablets (100 mg total) by mouth every 6 (six) hours as needed. 120 tablet 0  . vitamin E 400 UNIT capsule Take 400 Units by mouth daily.    . Vitamins/Minerals TABS Take 1 tablet by mouth daily.     No current facility-administered medications on file prior to visit.    BP 140/102 mmHg  Pulse 107  Temp(Src) 97.9 F (36.6 C) (Oral)  Ht 5\' 6"  (1.676 m)  Wt 166 lb 3.2 oz (75.388 kg)  BMI 26.84 kg/m2  SpO2 100%       Objective:   Physical Exam   General Mental Status- Alert. General Appearance- Not in acute distress.   Skin General: Color- Normal Color. Moisture- Normal Moisture.  Neck Carotid Arteries- Normal color. Moisture- Normal Moisture. No carotid bruits. No JVD.  Chest and Lung Exam Auscultation: Breath Sounds:-Normal.  Cardiovascular Auscultation:Rythm- Regular. Murmurs & Other Heart Sounds:Auscultation of the heart reveals- No Murmurs.  Abdomen Inspection:-Inspeection  Normal. Palpation/Percussion:Note:No mass. Palpation and Percussion of the abdomen reveal- mid suprapubic ender, Non Distended + BS, no rebound or guarding.  Back- faint lower lt cva tenderness   Neurologic Cranial Nerve exam:- CN III-XII intact(No nystagmus), symmetric smile. Strength:- 5/5 equal and symmetric strength both upper and lower extremities.       Assessment & Plan:

## 2014-10-13 NOTE — Progress Notes (Signed)
Pre visit review using our clinic review tool, if applicable. No additional management support is needed unless otherwise documented below in the visit note. 

## 2014-10-13 NOTE — Patient Instructions (Addendum)
htn- elevated today. But not on bp medications. Advised to restart today.  CVA pain left side- associtaed with stone. Will rx norco. Advised stop tramadol.  uti- suspect by ua results and may have stone interferring with flow. So will rx cipro and get culture.  Kidney stone-  Pt has imaging studies on Friday for bladder cancer. These studies may pick stones incidentally. So decided not to do ct today. Stone analysis sent out today  Proteinuria on ua- will get cmp today.   Follow up in 1 wk or as needed

## 2014-10-14 ENCOUNTER — Telehealth: Payer: Self-pay | Admitting: Family

## 2014-10-14 DIAGNOSIS — M549 Dorsalgia, unspecified: Secondary | ICD-10-CM

## 2014-10-14 DIAGNOSIS — R319 Hematuria, unspecified: Secondary | ICD-10-CM

## 2014-10-14 DIAGNOSIS — N200 Calculus of kidney: Secondary | ICD-10-CM

## 2014-10-14 NOTE — Telephone Encounter (Signed)
Note to referral staff.

## 2014-10-14 NOTE — Telephone Encounter (Signed)
Caller name: Tamirah Relation to pt: Call back number: [phone] Pharmacy:  Reason for call:   Patient states that she is still in pain since yesterdays visit.

## 2014-10-15 ENCOUNTER — Ambulatory Visit (HOSPITAL_BASED_OUTPATIENT_CLINIC_OR_DEPARTMENT_OTHER)
Admission: RE | Admit: 2014-10-15 | Discharge: 2014-10-15 | Disposition: A | Discharge status: Home / Self Care | Payer: BLUE CROSS/BLUE SHIELD | Source: Ambulatory Visit | Attending: Medical | Admitting: Medical

## 2014-10-15 ENCOUNTER — Telehealth: Payer: Self-pay | Admitting: Medical

## 2014-10-15 ENCOUNTER — Other Ambulatory Visit (INDEPENDENT_AMBULATORY_CARE_PROVIDER_SITE_OTHER): Payer: Medicaid Other

## 2014-10-15 ENCOUNTER — Ambulatory Visit (INDEPENDENT_AMBULATORY_CARE_PROVIDER_SITE_OTHER): Payer: Medicaid Other | Admitting: *Deleted

## 2014-10-15 DIAGNOSIS — M549 Dorsalgia, unspecified: Secondary | ICD-10-CM

## 2014-10-15 DIAGNOSIS — R319 Hematuria, unspecified: Secondary | ICD-10-CM

## 2014-10-15 DIAGNOSIS — D494 Neoplasm of unspecified behavior of bladder: Secondary | ICD-10-CM | POA: Insufficient documentation

## 2014-10-15 DIAGNOSIS — N200 Calculus of kidney: Secondary | ICD-10-CM

## 2014-10-15 DIAGNOSIS — R102 Pelvic and perineal pain: Secondary | ICD-10-CM | POA: Diagnosis present

## 2014-10-15 LAB — URINE CULTURE: Colony Count: >=100000

## 2014-10-15 LAB — CBC WITH DIFFERENTIAL/PLATELET
BASOS PCT: 0.5 % (ref 0.0–3.0)
Basophils Absolute: 0 10*3/uL (ref 0.0–0.1)
EOS PCT: 1.6 % (ref 0.0–5.0)
Eosinophils Absolute: 0.1 10*3/uL (ref 0.0–0.7)
HCT: 37.2 % (ref 36.0–46.0)
HEMOGLOBIN: 12.1 g/dL (ref 12.0–15.0)
LYMPHS PCT: 28.4 % (ref 12.0–46.0)
Lymphs Abs: 1.7 10*3/uL (ref 0.7–4.0)
MCHC: 32.4 g/dL (ref 30.0–36.0)
MCV: 78.5 fl (ref 78.0–100.0)
Monocytes Absolute: 0.5 10*3/uL (ref 0.1–1.0)
Monocytes Relative: 8.7 % (ref 3.0–12.0)
Neutro Abs: 3.7 10*3/uL (ref 1.4–7.7)
Neutrophils Relative %: 60.8 % (ref 43.0–77.0)
Platelets: 382 10*3/uL (ref 150.0–400.0)
RBC: 4.74 Mil/uL (ref 3.87–5.11)
RDW: 14.6 % (ref 11.5–15.5)
WBC: 6.1 10*3/uL (ref 4.0–10.5)

## 2014-10-15 MED ORDER — OXYCODONE-ACETAMINOPHEN 10-325 MG PO TABS: 1.0000 | ORAL_TABLET | Freq: Three times a day (TID) | ORAL | Status: DC | PRN

## 2014-10-15 MED ORDER — KETOROLAC TROMETHAMINE 60 MG/2ML IM SOLN
60.0000 mg | Freq: Once | INTRAMUSCULAR | Status: AC
  Administered 2014-10-15: 60 mg via INTRAMUSCULAR

## 2014-10-15 NOTE — Telephone Encounter (Signed)
I did talk with pt and advise her on ct renal stone protocol results. Will send message asking lpn to send ct abdomen results to pt oncologist.

## 2014-10-15 NOTE — Telephone Encounter (Signed)
Relation to pt: self  Call back number: [phone] Pharmacy: [facility] [place] [phone] (Phone) [phone] (Fax)         Reason for call:  Pt inquiring about lab results and requesting medication for pain. Pt is upset office did not contact her regarding orders and pain medication. Transferred patient to imaging so she can schedule CT appointment. Please follow up with patient as soon as possible. Thank you

## 2014-10-15 NOTE — Progress Notes (Signed)
Pre visit review using our clinic review tool, if applicable. No additional management support is needed unless otherwise documented below in the visit note.  Patient presented for CT scan at [facility] and verbalized to the provider that she was having some pain. Provider ordered for pt. to receive 60 mg of Toradol.  Patient tolerated injection well.

## 2014-10-15 NOTE — Telephone Encounter (Signed)
Pt bladder cancer is worsening by ct. Pain likley from cancer/tumor extension. Dc hydrocodone. Take oxycodone. Notified pt and stressed to see oncologist tomorrow and to get the pet scan and mri as planned.  No report of kidney stone.

## 2014-10-16 MED ORDER — AMOXICILLIN-POT CLAVULANATE 875-125 MG PO TABS: 1.0000 | ORAL_TABLET | Freq: Two times a day (BID) | ORAL | Status: DC

## 2014-10-16 NOTE — Telephone Encounter (Signed)
Spoke with patient regarding D/C Cipro and start Augmentin. Patient to call back with fax number and name of specialist.

## 2014-10-19 ENCOUNTER — Ambulatory Visit: Payer: BLUE CROSS/BLUE SHIELD | Admitting: Physical Therapy

## 2014-10-23 ENCOUNTER — Ambulatory Visit: Payer: BLUE CROSS/BLUE SHIELD | Admitting: Physical Therapy

## 2014-10-26 ENCOUNTER — Ambulatory Visit: Payer: BLUE CROSS/BLUE SHIELD | Admitting: Rehabilitation

## 2014-10-29 ENCOUNTER — Other Ambulatory Visit: Payer: Self-pay | Admitting: Medical

## 2014-10-29 ENCOUNTER — Telehealth: Payer: Self-pay | Admitting: Family

## 2014-10-29 NOTE — Telephone Encounter (Signed)
error 

## 2014-10-29 NOTE — Telephone Encounter (Signed)
Caller name: Catha Nottingham  Relation to pt: [facility] Nurse  Call back number: [phone]    Reason for call:  Nurse called on patient behalf requesting PA to prescribe a few pills due to pain.  Patient called crying. [facility] nurse will be leaving office in a few and stated if PA can follow up with her tomorrow 10/30/14.

## 2014-10-29 NOTE — Telephone Encounter (Signed)
Pt has some pain. Hx of kidney stones. Also I think some other issues such as bladder cancer. Call and can you get her scheduled?

## 2014-10-29 NOTE — Telephone Encounter (Signed)
Relation to BO:MQTT Call back number: [phone]   Reason for call:  Patient calling back checking on the status of message below

## 2014-10-29 NOTE — Telephone Encounter (Signed)
Pt called stating she needs refill on hydrocodone as she is still having pain in bladder. She has taken the antibiotics too but still hurting. States Percell Miller told her to call if she needed a refill. Pt would like to pick up this afternoon as she is off today.

## 2014-10-30 ENCOUNTER — Ambulatory Visit: Payer: BLUE CROSS/BLUE SHIELD | Admitting: Medical

## 2014-10-30 ENCOUNTER — Ambulatory Visit (INDEPENDENT_AMBULATORY_CARE_PROVIDER_SITE_OTHER): Payer: Medicaid Other | Admitting: Medical

## 2014-10-30 ENCOUNTER — Encounter: Payer: Self-pay | Admitting: Medical

## 2014-10-30 ENCOUNTER — Telehealth: Payer: Self-pay | Admitting: Family

## 2014-10-30 ENCOUNTER — Ambulatory Visit: Payer: BLUE CROSS/BLUE SHIELD | Admitting: Rehabilitation

## 2014-10-30 VITALS — BP 152/76 | HR 82 | Temp 99.2°F | Ht 66.0 in | Wt 165.6 lb

## 2014-10-30 DIAGNOSIS — C679 Malignant neoplasm of bladder, unspecified: Secondary | ICD-10-CM | POA: Diagnosis not present

## 2014-10-30 DIAGNOSIS — R8290 Unspecified abnormal findings in urine: Secondary | ICD-10-CM | POA: Diagnosis not present

## 2014-10-30 DIAGNOSIS — M545 Low back pain: Secondary | ICD-10-CM | POA: Diagnosis not present

## 2014-10-30 DIAGNOSIS — M549 Dorsalgia, unspecified: Secondary | ICD-10-CM | POA: Insufficient documentation

## 2014-10-30 LAB — POCT URINALYSIS DIPSTICK
Bilirubin, UA: 1
Blood, UA: POSITIVE
Glucose, UA: NEGATIVE
Ketones, UA: 5
Nitrite, UA: NEGATIVE
Protein, UA: 2000
SPEC GRAV UA: 1.025
Urobilinogen, UA: 0.2
pH, UA: 6

## 2014-10-30 MED ORDER — OXYCODONE HCL 5 MG PO TABS: 5.0000 mg | ORAL_TABLET | ORAL | Status: DC | PRN

## 2014-10-30 NOTE — Telephone Encounter (Signed)
Scheduled patient for appointment this am. C/O pain.

## 2014-10-30 NOTE — Assessment & Plan Note (Signed)
Recent with her bladder cancer. Also some associated chronic lower abdomen suprapubic region pain. Tumor is growing per last CT.   I will  rx  Limited rx of oxycodone. [facility] sending another rx in mail. I want you to schedule appointment with [facility] NP next week. During the interim contact [facility] to find out if they area going to do pain management for you.   If [facility] not going to do pain management then your pcp should be aware and decide if our clinic will be the prescriber of if other pain management clinic will.

## 2014-10-30 NOTE — Progress Notes (Signed)
Subjective:    Patient ID: Ruth Maldonado, female    DOB: 05/15/1955, 59 y.o.   MRN: [id number]  HPI  Pt in today stating all day severe back and abdomen pain  yesterday. Pt has known bladder cancer. Pt has extension bladder cancer. 2 tumors. Pt has pain in her bladder. And some pain in her lower back.  Pt on last visit had kidney stone. She brought the stone in. Ct showed some bladder cancer extension.  Pt had uti on last visit. She thinks getting some odor to urine again.  Pt states [facility] is going to send her rx in mail. She takes oxycodone 5mg  every 4 hours for pain. Pt going to see Cancer doctor in near future.   Pain at time 8-9 level pain.    Review of Systems  Constitutional: Negative for fever, chills and fatigue.  Respiratory: Negative for cough, chest tightness and shortness of breath.   Cardiovascular: Negative for chest pain and palpitations.  Gastrointestinal: Positive for abdominal pain.       More pain over her bladder.  Genitourinary: Negative for dysuria, frequency, flank pain, decreased urine volume, pelvic pain and dyspareunia.       Odor to urine  Musculoskeletal: Positive for back pain.  Neurological: Negative for dizziness, seizures and light-headedness.  Hematological: Negative for adenopathy. Does not bruise/bleed easily.  Psychiatric/Behavioral: Negative for behavioral problems and confusion.   Past Medical History  Diagnosis Date  . Hypertension   . History of uterine fibroid   . Bladder tumor   . Gross hematuria   . Bladder spasm   . Wears contact lenses     History   Social History  . Marital Status: Married    Spouse Name: N/A  . Number of Children: N/A  . Years of Education: N/A   Occupational History  . Not on file.   Social History Main Topics  . Smoking status: Former Smoker -- 1.00 packs/day for 24 years    Types: Cigarettes    Quit date: 06/11/1998  . Smokeless tobacco: Never Used  . Alcohol Use: No  . Drug Use: No   Comment: previous hx of cocaine and heroine abuse- quit 18 years ago  . Sexual Activity: Not on file   Other Topics Concern  . 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    Works in [employer]   6 grand children   Married 6 years   Husband is 67 and retired   1 son- lives locally.     Past Surgical History  Procedure Laterality Date  . Abdominal hysterectomy  2000  . Colposcopy    . Transurethral resection of bladder tumor with mitomycin-c N/A 06/13/2013    Procedure: CYSTOSCOPY TRANSURETHERAL RESECTION BLADDER TUMOR BILATERAL  RETROGRADE PYLEOGRAM INSULATION OF MITOMYCON C;  Surgeon: Ardis Hughs, MD;  [facility];  Service: Urology;  Laterality: N/A;  . Cystoscopy w/ retrogrades N/A 06/13/2013    Procedure: CYSTOSCOPY WITH RETROGRADE PYELOGRAM;  Surgeon: Ardis Hughs, MD;  Location: [facility];  Service: Urology;  Laterality: N/A;    Family History  Problem Relation Age of Onset  . Epilepsy Mother     temporal lobe epilepsy  . Emphysema Mother   . Cancer Mother     lung  . Hypertension Mother   . Heart attack Father     Allergies  Allergen Reactions  . Adhesive [  Tape] Itching    Current Outpatient Prescriptions on File Prior to Visit  Medication Sig Dispense Refill  . Cholecalciferol (VITAMIN D3) 1000 UNITS CAPS Take 1 capsule by mouth daily.    . clobetasol ointment (TEMOVATE) 0.05 % Apply to scalp as needed  2  . hydrochlorothiazide (HYDRODIURIL) 25 MG tablet TAKE ONE TABLET BY MOUTH ONCE DAILY 30 tablet 5  . HYDROcodone-acetaminophen (NORCO) 5-325 MG per tablet Take 1 tablet by mouth every 6 (six) hours as needed for moderate pain. 20 tablet 0  . IODINE, KELP, PO TAKE 1 DROPPER PER DAY    . losartan (COZAAR) 50 MG tablet TAKE ONE TABLET BY MOUTH ONCE DAILY 30 tablet 5  . milk thistle 175 MG tablet Take 175 mg by mouth daily as needed.     [name] Kitchen  OVER THE COUNTER MEDICATION TUMERIC.  1 dropper per day.    [name] Kitchen OVER THE COUNTER MEDICATION LEMON GRASS ESSENTIAL OILS.  Once a day.    [name] Kitchen OVER THE COUNTER MEDICATION FRANKINCENSE.  2 drops under tongue three times daily.    [name] Kitchen OVER THE COUNTER MEDICATION JUNIPER BERRY AND GRAPEFRUIT.  Mix together take in 1 veggie capsule daily.    [name] Kitchen OVER THE COUNTER MEDICATION LAVENDER AND YLANG YLANG. Apply to bottom of feet once a day.    . vitamin E 400 UNIT capsule Take 400 Units by mouth daily.    . Vitamins/Minerals TABS Take 1 tablet by mouth daily.    [name] Kitchen amoxicillin-clavulanate (AUGMENTIN) 875-125 MG per tablet Take 1 tablet by mouth 2 (two) times daily. (Patient not taking: Reported on 10/30/2014) 14 tablet 0  . ciprofloxacin (CIPRO) 500 MG tablet Take 1 tablet (500 mg total) by mouth 2 (two) times daily. (Patient not taking: Reported on 10/30/2014) 14 tablet 0  . diclofenac (VOLTAREN) 75 MG EC tablet Take 1 tablet (75 mg total) by mouth 2 (two) times daily. (Patient not taking: Reported on 10/30/2014) 60 tablet 1  . hyoscyamine (LEVBID) 0.375 MG 12 hr tablet Take 1 tablet (0.375 mg total) by mouth 2 (two) times daily. (Patient not taking: Reported on 10/30/2014) 60 tablet 2  . OVER THE COUNTER MEDICATION GERANIUM AND CLARYSAGE.  Place in capsule and take once to twice a day.    . oxyCODONE-acetaminophen (PERCOCET) 10-325 MG per tablet Take 1 tablet by mouth every 8 (eight) hours as needed for pain. 42 tablet 0  . traMADol (ULTRAM) 50 MG tablet Take 2 tablets (100 mg total) by mouth every 6 (six) hours as needed. (Patient not taking: Reported on 10/30/2014) 120 tablet 0   No current facility-administered medications on file prior to visit.    BP 152/76 mmHg  Pulse 82  Temp(Src) 99.2 F (37.3 C) (Oral)  Ht 5\' 6"  (1.676 m)  Wt 165 lb 9.6 oz (75.116 kg)  BMI 26.74 kg/m2  SpO2 100%       Objective:   Physical Exam  General Appearance- Not in acute distress. Appears pain. But states at times during day  severe.  HEENT Eyes- Scleraeral/Conjuntiva-bilat- Not Yellow. Mouth & Throat- Normal.  Chest and Lung Exam Auscultation: Breath sounds:-Normal. Adventitious sounds:- No Adventitious sounds.  Cardiovascular Auscultation:Rythm - Regular. Heart Sounds -Normal heart sounds.  Abdomen Inspection:-Inspection Normal.  Palpation/Perucssion: Palpation and Percussion of the abdomen reveal- Non Tender, No Rebound tenderness, No rigidity(Guarding) and No Palpable abdominal masses.  Liver:-Normal.  Spleen:- Normal.   Back- no cva tenderness.Faint mid lspine pain presently on exam       Assessment &  Plan:

## 2014-10-30 NOTE — Progress Notes (Signed)
Pre visit review using our clinic review tool, if applicable. No additional management support is needed unless otherwise documented below in the visit note. 

## 2014-10-30 NOTE — Addendum Note (Signed)
Addended by: Bunnie Domino on: 10/30/2014 10:51 AM   Modules accepted: Orders

## 2014-10-30 NOTE — Patient Instructions (Addendum)
LBP (low back pain) Recent with her bladder cancer. Also some associated chronic lower abdomen suprapubic region pain. Tumor is growing per last CT.   I will  rx  Limited rx of oxycodone. [facility] sending another rx in mail. I want you to schedule appointment with [facility] NP next week. During the interim contact [facility] to find out if they area going to do pain management for you.   If [facility] not going to do pain management then your pcp should be aware and decide if our clinic will be the prescriber of if other pain management clinic will.   For your urine odor will get urine culture and if +. Will rx antibiotic.  Follow up with oncologist as scheduled.

## 2014-10-30 NOTE — Telephone Encounter (Signed)
FYI - Ruth Maldonado left the office stating she will have to call us back to schedule an appt with [name] for next week 8/3-8/5. She did not have her schedule with her to see when she can come in.

## 2014-10-31 LAB — URINE CULTURE: Colony Count: 50000

## 2014-11-01 ENCOUNTER — Telehealth: Payer: Self-pay | Admitting: Medical

## 2014-11-01 MED ORDER — AMOXICILLIN-POT CLAVULANATE 875-125 MG PO TABS: 1.0000 | ORAL_TABLET | Freq: Two times a day (BID) | ORAL | Status: DC

## 2014-11-01 NOTE — Telephone Encounter (Signed)
rx antibiotic augmentin

## 2014-11-02 ENCOUNTER — Ambulatory Visit: Payer: BLUE CROSS/BLUE SHIELD | Admitting: Physical Therapy

## 2014-11-02 ENCOUNTER — Ambulatory Visit (INDEPENDENT_AMBULATORY_CARE_PROVIDER_SITE_OTHER): Payer: Medicaid Other | Admitting: Family

## 2014-11-02 ENCOUNTER — Telehealth: Payer: Self-pay | Admitting: Family

## 2014-11-02 ENCOUNTER — Ambulatory Visit: Payer: BLUE CROSS/BLUE SHIELD | Admitting: Family Medicine

## 2014-11-02 ENCOUNTER — Encounter: Payer: Self-pay | Admitting: Family

## 2014-11-02 VITALS — BP 140/84 | HR 82 | Temp 98.2°F | Resp 16 | Ht 66.5 in | Wt 165.0 lb

## 2014-11-02 DIAGNOSIS — C679 Malignant neoplasm of bladder, unspecified: Secondary | ICD-10-CM

## 2014-11-02 MED ORDER — OXYCODONE HCL ER 10 MG PO T12A: 10.0000 mg | EXTENDED_RELEASE_TABLET | Freq: Two times a day (BID) | ORAL | Status: DC

## 2014-11-02 MED ORDER — OXYCODONE HCL 5 MG PO TABS
5.0000 mg | ORAL_TABLET | ORAL | Status: DC | PRN
Start: 2014-11-02 — End: 2015-04-05

## 2014-11-02 NOTE — Assessment & Plan Note (Signed)
Mets to bone and soft tissue on recent PET.  Pt to start palliative chemo.  She is struggling with pain management.  I think she really needs a longer acting pain med on board. Will start oxycontin 10mg  bid. Advised pt OK to take oxycodone 1 tab every 4 hours but no more.  D/C tramadol. Advised pt not to take more than her prescribed amount due to risk of respiratory depression and death. She verbalizes understanding. I did contact poison control for further direction re: tramadol overdose. They advised evaluate BP (risk of hypotension) and treatment of any GI side effects (N/V) pt has not GI complaints.  BP Readings from Last 3 Encounters:  11/02/14 140/84  10/30/14 152/76  10/13/14 140/102

## 2014-11-02 NOTE — Telephone Encounter (Signed)
FYI

## 2014-11-02 NOTE — Telephone Encounter (Signed)
Received call from Talbert Forest, nurse navigator at [facility]. She states they follow pt for bladder cancer. Pt called in stating she was in pain and took 6 tramadol at once this morning. Sonia Baller states pt does not need ER visit but needs f/u with PCP office. Pt scheduled for today at 1:00pm with Dr. Etter Sjogren.

## 2014-11-02 NOTE — Progress Notes (Signed)
Subjective:    Patient ID: Ruth Maldonado, female    DOB: 28-Oct-1955, 59 y.o.   MRN: [id number]  HPI   Ruth Maldonado is a 59 yr old female with history of metastatic bladder cancer who presents today at the direction of oncology due to overdose of tramadol.  Patient admits to taking 5 tablets of tramadol last night and 5 tablets of tramadol this AM. Reports that she continues to have right lower abdominal pain despite large doses of tramadol. She did have an rx for oxycodone, but reports that it took 3 tabs to control her pain and she ran out.   She tells me that she has finally agreed to treatment of her metastatatic bladder cancer but "only because of the pain or I wouldn't do it.  She also reports low back pain. She is now following with Dr. Kalman Shan at [facility] oncology and plans to complete palliative chemotherapy. Record is reviewed in care everywhere.    Review of Systems See HPI  Dry mouth - per pt  Past Medical History  Diagnosis Date  . Hypertension   . History of uterine fibroid   . Bladder tumor   . Gross hematuria   . Bladder spasm   . Wears contact lenses     History   Social History  . Marital Status: Married    Spouse Name: N/A  . Number of Children: N/A  . Years of Education: N/A   Occupational History  . Not on file.   Social History Main Topics  . Smoking status: Former Smoker -- 1.00 packs/day for 24 years    Types: Cigarettes    Quit date: 06/11/1998  . Smokeless tobacco: Never Used  . Alcohol Use: No  . Drug Use: No     Comment: previous hx of cocaine and heroine abuse- quit 18 years ago  . Sexual Activity: Not on file   Other Topics Concern  . 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    Works in [employer]   6 grand children   Married 10 years   Husband is 66 and retired   1 son- lives locally.     Past Surgical History  Procedure Laterality Date  . Abdominal  hysterectomy  2000  . Colposcopy    . Transurethral resection of bladder tumor with mitomycin-c N/A 06/13/2013    Procedure: CYSTOSCOPY TRANSURETHERAL RESECTION BLADDER TUMOR BILATERAL  RETROGRADE PYLEOGRAM INSULATION OF MITOMYCON C;  Surgeon: Ardis Hughs, MD;  Location: [facility];  Service: Urology;  Laterality: N/A;  . Cystoscopy w/ retrogrades N/A 06/13/2013    Procedure: CYSTOSCOPY WITH RETROGRADE PYELOGRAM;  Surgeon: Ardis Hughs, MD;  [facility];  Service: Urology;  Laterality: N/A;    Family History  Problem Relation Age of Onset  . Epilepsy Mother     temporal lobe epilepsy  . Emphysema Mother   . Cancer Mother     lung  . Hypertension Mother   . Heart attack Father     Allergies  Allergen Reactions  . Adhesive [Tape] Itching    Current Outpatient Prescriptions on File Prior to Visit  Medication Sig Dispense Refill  . amoxicillin-clavulanate (AUGMENTIN) 875-125 MG per tablet Take 1 tablet by mouth 2 (two) times daily. 14 tablet 0  . Cholecalciferol (VITAMIN D3) 1000 UNITS CAPS Take 1 capsule by mouth daily.    . hydrochlorothiazide (HYDRODIURIL) 25 MG  tablet TAKE ONE TABLET BY MOUTH ONCE DAILY 30 tablet 5  . losartan (COZAAR) 50 MG tablet TAKE ONE TABLET BY MOUTH ONCE DAILY 30 tablet 5  . milk thistle 175 MG tablet Take 175 mg by mouth daily as needed.     [name] Kitchen OVER THE COUNTER MEDICATION TUMERIC.  1 dropper per day.    [name] Kitchen OVER THE COUNTER MEDICATION LEMON GRASS ESSENTIAL OILS.  Once a day.    [name] Kitchen OVER THE COUNTER MEDICATION FRANKINCENSE.  2 drops under tongue three times daily.    [name] Kitchen OVER THE COUNTER MEDICATION JUNIPER BERRY AND GRAPEFRUIT.  Mix together take in 1 veggie capsule daily.    [name] Kitchen OVER THE COUNTER MEDICATION GERANIUM AND CLARYSAGE.  Place in capsule and take once to twice a day.    [name] Kitchen OVER THE COUNTER MEDICATION LAVENDER AND YLANG YLANG. Apply to bottom of feet once a day.    . oxyCODONE-acetaminophen (PERCOCET)  10-325 MG per tablet Take 1 tablet by mouth every 8 (eight) hours as needed for pain. 42 tablet 0  . vitamin E 400 UNIT capsule Take 400 Units by mouth daily.    . Vitamins/Minerals TABS Take 1 tablet by mouth daily.     No current facility-administered medications on file prior to visit.    BP 140/84 mmHg  Pulse 82  Temp(Src) 98.2 F (36.8 C) (Oral)  Resp 16  Ht 5' 6.5" (1.689 m)  Wt 165 lb (74.844 kg)  BMI 26.24 kg/m2  SpO2 99%       Objective:   Physical Exam  Constitutional: She is oriented to person, place, and time. She appears well-developed and well-nourished.  Non-drowsy, alert, NAD  HENT:  Head: Normocephalic and atraumatic.  Cardiovascular: Normal rate, regular rhythm and normal heart sounds.   No murmur heard. Pulmonary/Chest: Effort normal and breath sounds normal. No respiratory distress. She has no wheezes.  Abdominal: Soft. Bowel sounds are normal. She exhibits no distension. There is no tenderness. There is no rebound.  Neurological: She is alert and oriented to person, place, and time.  Skin: Skin is warm and dry.  Psychiatric: She has a normal mood and affect. Her behavior is normal. Judgment and thought content normal.          Assessment & Plan:  Pt advised to take augmentin which was prescribed by Evern Core PA-C for UTI.

## 2014-11-02 NOTE — Patient Instructions (Addendum)
Stop tramadol. Start Oxycontin 10mg  every 12 hours. If you have breakthrough pain you may take oxycodone 5mg  one tab only every 4-6 hours.   Follow up in 1 month.

## 2014-11-02 NOTE — Progress Notes (Signed)
Pre visit review using our clinic review tool, if applicable. No additional management support is needed unless otherwise documented below in the visit note. 

## 2014-11-02 NOTE — Telephone Encounter (Signed)
Scheduled with Debbrah Alar- NP aware.

## 2014-11-03 NOTE — Addendum Note (Signed)
Addended by: Vernie Shanks E on: 11/03/2014 11:00 AM   Modules accepted: Orders

## 2014-11-04 ENCOUNTER — Telehealth: Payer: Self-pay | Admitting: Family

## 2014-11-04 MED ORDER — CEPHALEXIN 500 MG PO CAPS: 500.0000 mg | ORAL_CAPSULE | Freq: Two times a day (BID) | ORAL | Status: DC

## 2014-11-04 NOTE — Telephone Encounter (Signed)
Caller name: Jemma Rasp  Relationship to patient: Self  Can be reached: [phone] Pharmacy:  Reason for call: Pt says that the Augmentin that was prescribed isn't working because she cuts the pill in half because its to big. Pt still has infection. She wants to know if there is an alternated form of antibiotic that she could take instead of the pill because she cant swallow them.

## 2014-11-04 NOTE — Telephone Encounter (Signed)
Pt has bladder cancer. So what symptoms does she have? She could come by for rocephin 1 gram(nurse visit) and  I could rx cephalexin also. But if make change do recommend the IM injection.  Stop augmentin if follows above recommendations.

## 2014-11-05 ENCOUNTER — Ambulatory Visit (INDEPENDENT_AMBULATORY_CARE_PROVIDER_SITE_OTHER): Payer: Medicaid Other | Admitting: *Deleted

## 2014-11-05 DIAGNOSIS — N390 Urinary tract infection, site not specified: Secondary | ICD-10-CM

## 2014-11-05 MED ORDER — CEFTRIAXONE SODIUM 1 G IJ SOLR
1.0000 g | INTRAMUSCULAR | Status: DC
  Administered 2014-11-05: 1 g via INTRAMUSCULAR

## 2014-11-05 NOTE — Telephone Encounter (Signed)
Called patient who had already been called and scheduled for Rocephin injection. States she will pick up Rx for Cephalexin.

## 2014-11-05 NOTE — Progress Notes (Signed)
Pre visit review using our clinic review tool, if applicable. No additional management support is needed unless otherwise documented below in the visit note.  Patient presents for Rocephin injection per phone note:  Mackie Pai, PA-C at 11/04/2014 5:32 PM     Status: Signed       Expand All Collapse All   Pt has bladder cancer. So what symptoms does she have? She could come by for rocephin 1 gram(nurse visit) and I could rx cephalexin also. But if make change do recommend the IM injection.  Stop augmentin if follows above recommendations.       Patient tolerated injection well.  Notified to wait in lobby 20-30 minutes.  No signs or symptoms of reaction.

## 2014-12-14 ENCOUNTER — Encounter: Payer: Self-pay | Admitting: Family

## 2014-12-14 ENCOUNTER — Ambulatory Visit (INDEPENDENT_AMBULATORY_CARE_PROVIDER_SITE_OTHER): Payer: Medicaid Other | Admitting: Family

## 2014-12-14 VITALS — BP 138/78 | HR 98 | Temp 98.1°F | Resp 16 | Ht 66.5 in | Wt 164.8 lb

## 2014-12-14 DIAGNOSIS — C679 Malignant neoplasm of bladder, unspecified: Secondary | ICD-10-CM | POA: Diagnosis not present

## 2014-12-14 DIAGNOSIS — N211 Calculus in urethra: Secondary | ICD-10-CM | POA: Diagnosis not present

## 2014-12-14 NOTE — Progress Notes (Signed)
Subjective:    Patient ID: Ruth Maldonado, female    DOB: 11/08/1955, 59 y.o.   MRN: [id number]  HPI  She reports that she noted 3 weeks ago that she developed abdominal pain and states that she noted a hole near her vagina and her husband pulled a string of stones out of it.  She reports that after removal of these stones her symptoms improved.    Bladder CA- she is undergoing palliative chemo with Dr. Kalman Shan at [facility].   Constipation- She reports some constipation.  She is taking senna and stool softners along with miralax.  This is helping to regulate her stools.  She continues to use oxycontin/oxy ir which is helping to control her pain.   Review of Systems    see HPI  Past Medical History  Diagnosis Date  . Hypertension   . History of uterine fibroid   . Bladder tumor   . Gross hematuria   . Bladder spasm   . Wears contact lenses     Social History   Social History  . Marital Status: Married    Spouse Name: N/A  . Number of Children: N/A  . Years of Education: N/A   Occupational History  . Not on file.   Social History Main Topics  . Smoking status: Former Smoker -- 1.00 packs/day for 24 years    Types: Cigarettes    Quit date: 06/11/1998  . Smokeless tobacco: Never Used  . Alcohol Use: No  . Drug Use: No     Comment: previous hx of cocaine and heroine abuse- quit 18 years ago  . Sexual Activity: Not on file   Other Topics Concern  . 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    Works in [employer]   6 grand children   Married 76 years   Husband is 81 and retired   1 son- lives locally.     Past Surgical History  Procedure Laterality Date  . Abdominal hysterectomy  2000  . Colposcopy    . Transurethral resection of bladder tumor with mitomycin-c N/A 06/13/2013    Procedure: CYSTOSCOPY TRANSURETHERAL RESECTION BLADDER TUMOR BILATERAL  RETROGRADE PYLEOGRAM INSULATION OF MITOMYCON C;   Surgeon: Ardis Hughs, MD;  Location: [facility];  Service: Urology;  Laterality: N/A;  . Cystoscopy w/ retrogrades N/A 06/13/2013    Procedure: CYSTOSCOPY WITH RETROGRADE PYELOGRAM;  Surgeon: Ardis Hughs, MD;  Location: [facility];  Service: Urology;  Laterality: N/A;    Family History  Problem Relation Age of Onset  . Epilepsy Mother     temporal lobe epilepsy  . Emphysema Mother   . Cancer Mother     lung  . Hypertension Mother   . Heart attack Father     Allergies  Allergen Reactions  . Adhesive [Tape] Itching    Current Outpatient Prescriptions on File Prior to Visit  Medication Sig Dispense Refill  . cephALEXin (KEFLEX) 500 MG capsule Take 1 capsule (500 mg total) by mouth 2 (two) times daily. 20 capsule 0  . Cholecalciferol (VITAMIN D3) 1000 UNITS CAPS Take 1 capsule by mouth daily.    . hydrochlorothiazide (HYDRODIURIL) 25 MG tablet TAKE ONE TABLET BY MOUTH ONCE DAILY 30 tablet 5  . losartan (COZAAR) 50 MG tablet TAKE ONE TABLET BY MOUTH ONCE DAILY 30 tablet 5  . milk thistle 175 MG tablet Take 175 mg by mouth  daily as needed.     [name] Kitchen OVER THE COUNTER MEDICATION TUMERIC.  1 dropper per day.    [name] Kitchen OVER THE COUNTER MEDICATION LEMON GRASS ESSENTIAL OILS.  Once a day.    [name] Kitchen OVER THE COUNTER MEDICATION FRANKINCENSE.  2 drops under tongue three times daily.    [name] Kitchen OVER THE COUNTER MEDICATION JUNIPER BERRY AND GRAPEFRUIT.  Mix together take in 1 veggie capsule daily.    [name] Kitchen OVER THE COUNTER MEDICATION GERANIUM AND CLARYSAGE.  Place in capsule and take once to twice a day.    [name] Kitchen OVER THE COUNTER MEDICATION LAVENDER AND YLANG YLANG. Apply to bottom of feet once a day.    . oxyCODONE (OXY IR/ROXICODONE) 5 MG immediate release tablet Take 1 tablet (5 mg total) by mouth every 4 (four) hours as needed for severe pain. 100 tablet 0  . OxyCODONE (OXYCONTIN) 10 mg T12A 12 hr tablet Take 1 tablet (10 mg total) by mouth every 12 (twelve) hours. 14 tablet 0    . vitamin E 400 UNIT capsule Take 400 Units by mouth daily.    . Vitamins/Minerals TABS Take 1 tablet by mouth daily.     Current Facility-Administered Medications on File Prior to Visit  Medication Dose Route Frequency Provider Last Rate Last Dose  . cefTRIAXone (ROCEPHIN) injection 1 g  1 g Intramuscular Q24H Edward Saguier, PA-C   1 g at 11/05/14 0950    BP 138/78 mmHg  Pulse 98  Temp(Src) 98.1 F (36.7 C) (Oral)  Resp 16  Ht 5' 6.5" (1.689 m)  Wt 164 lb 12.8 oz (74.753 kg)  BMI 26.20 kg/m2  SpO2 100%    Objective:   Physical Exam  Constitutional: She is oriented to person, place, and time. She appears well-developed and well-nourished.  HENT:  Head: Normocephalic and atraumatic.  Cardiovascular: Normal rate, regular rhythm and normal heart sounds.   No murmur heard. Pulmonary/Chest: Effort normal and breath sounds normal. No respiratory distress. She has no wheezes.  Genitourinary:  Small opening noted adjacent to the urethra at 9 oclock.    Musculoskeletal: She exhibits no edema.  Neurological: She is alert and oriented to person, place, and time.  Skin: Skin is warm and dry.  Psychiatric: She has a normal mood and affect. Her behavior is normal. Judgment and thought content normal.          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  She brought the stones with her today.  I would like her to see urology and have asked her to bring the stones with her to urology.

## 2014-12-14 NOTE — Assessment & Plan Note (Signed)
She continues palliative chemo and pain meds- management per oncology.

## 2014-12-14 NOTE — Progress Notes (Signed)
Pre visit review using our clinic review tool, if applicable. No additional management support is needed unless otherwise documented below in the visit note. 

## 2014-12-14 NOTE — Patient Instructions (Signed)
You will be contacted about your referral to urology. Please follow up in 3 months.

## 2014-12-28 ENCOUNTER — Ambulatory Visit: Payer: BLUE CROSS/BLUE SHIELD | Admitting: Family

## 2015-01-25 ENCOUNTER — Telehealth: Payer: Self-pay | Admitting: *Deleted

## 2015-01-25 NOTE — Telephone Encounter (Signed)
Per verbal from PCP, received call from MD at [facility] stating they are wanting to place pt's port tomorrow and pt needs to have a STAT CBC today to ensure normal WBC prior to placement. Left message for pt to return my call.

## 2015-01-25 NOTE — Telephone Encounter (Signed)
Attempted to reach pt, mailbox is full 

## 2015-01-25 NOTE — Telephone Encounter (Signed)
Spoke with Dr. Doren Custard and let him know that we were not successful in getting in touch with pt and we were unable to repeat CBC.  He verbalizes understanding.

## 2015-01-25 NOTE — Telephone Encounter (Signed)
Attempted to reach pt and received message that mailbox is full. 

## 2015-02-02 ENCOUNTER — Telehealth: Payer: Self-pay | Admitting: *Deleted

## 2015-02-02 NOTE — Telephone Encounter (Signed)
Pt signed ROI received from [facility]. Forwarded to [place] to scan/email to medical records. JG//CMA

## 2015-03-04 ENCOUNTER — Telehealth: Payer: Self-pay | Admitting: Family

## 2015-03-04 NOTE — Telephone Encounter (Signed)
Called pt, lvm to call back in to schedule flu vac

## 2015-04-03 ENCOUNTER — Inpatient Hospital Stay (HOSPITAL_COMMUNITY)
Admission: EM | Admit: 2015-04-03 | Discharge: 2015-04-05 | DRG: 813 | Disposition: A | Discharge status: Home / Self Care | Payer: Medicaid Other | Attending: Internal Medicine | Admitting: Internal Medicine

## 2015-04-03 ENCOUNTER — Encounter (HOSPITAL_COMMUNITY): Payer: Self-pay

## 2015-04-03 DIAGNOSIS — G893 Neoplasm related pain (acute) (chronic): Secondary | ICD-10-CM | POA: Diagnosis present

## 2015-04-03 DIAGNOSIS — R310 Gross hematuria: Secondary | ICD-10-CM | POA: Diagnosis present

## 2015-04-03 DIAGNOSIS — C679 Malignant neoplasm of bladder, unspecified: Secondary | ICD-10-CM | POA: Diagnosis present

## 2015-04-03 DIAGNOSIS — F419 Anxiety disorder, unspecified: Secondary | ICD-10-CM | POA: Diagnosis present

## 2015-04-03 DIAGNOSIS — Z801 Family history of malignant neoplasm of trachea, bronchus and lung: Secondary | ICD-10-CM

## 2015-04-03 DIAGNOSIS — Z820 Family history of epilepsy and other diseases of the nervous system: Secondary | ICD-10-CM

## 2015-04-03 DIAGNOSIS — C7951 Secondary malignant neoplasm of bone: Secondary | ICD-10-CM | POA: Diagnosis present

## 2015-04-03 DIAGNOSIS — Z9109 Other allergy status, other than to drugs and biological substances: Secondary | ICD-10-CM

## 2015-04-03 DIAGNOSIS — Z79899 Other long term (current) drug therapy: Secondary | ICD-10-CM

## 2015-04-03 DIAGNOSIS — D509 Iron deficiency anemia, unspecified: Secondary | ICD-10-CM | POA: Diagnosis present

## 2015-04-03 DIAGNOSIS — Z906 Acquired absence of other parts of urinary tract: Secondary | ICD-10-CM

## 2015-04-03 DIAGNOSIS — R52 Pain, unspecified: Secondary | ICD-10-CM

## 2015-04-03 DIAGNOSIS — Z87891 Personal history of nicotine dependence: Secondary | ICD-10-CM

## 2015-04-03 DIAGNOSIS — D649 Anemia, unspecified: Secondary | ICD-10-CM | POA: Diagnosis present

## 2015-04-03 DIAGNOSIS — T451X5A Adverse effect of antineoplastic and immunosuppressive drugs, initial encounter: Secondary | ICD-10-CM | POA: Diagnosis present

## 2015-04-03 DIAGNOSIS — C799 Secondary malignant neoplasm of unspecified site: Secondary | ICD-10-CM

## 2015-04-03 DIAGNOSIS — D62 Acute posthemorrhagic anemia: Secondary | ICD-10-CM | POA: Diagnosis present

## 2015-04-03 DIAGNOSIS — D6959 Other secondary thrombocytopenia: Principal | ICD-10-CM | POA: Diagnosis present

## 2015-04-03 DIAGNOSIS — Z8249 Family history of ischemic heart disease and other diseases of the circulatory system: Secondary | ICD-10-CM

## 2015-04-03 DIAGNOSIS — I10 Essential (primary) hypertension: Secondary | ICD-10-CM | POA: Diagnosis present

## 2015-04-03 DIAGNOSIS — Z825 Family history of asthma and other chronic lower respiratory diseases: Secondary | ICD-10-CM

## 2015-04-03 DIAGNOSIS — E876 Hypokalemia: Secondary | ICD-10-CM | POA: Diagnosis present

## 2015-04-03 DIAGNOSIS — Z90710 Acquired absence of both cervix and uterus: Secondary | ICD-10-CM

## 2015-04-03 DIAGNOSIS — D696 Thrombocytopenia, unspecified: Secondary | ICD-10-CM

## 2015-04-03 LAB — ABO/RH: ABO/RH(D): A POS

## 2015-04-03 LAB — COMPREHENSIVE METABOLIC PANEL
ALT: 15 U/L (ref 14–54)
ANION GAP: 9 (ref 5–15)
AST: 20 U/L (ref 15–41)
Albumin: 3.2 g/dL — ABNORMAL LOW (ref 3.5–5.0)
Alkaline Phosphatase: 86 U/L (ref 38–126)
BUN: 10 mg/dL (ref 6–20)
CHLORIDE: 101 mmol/L (ref 101–111)
CO2: 26 mmol/L (ref 22–32)
Calcium: 8.8 mg/dL — ABNORMAL LOW (ref 8.9–10.3)
Creatinine, Ser: 0.91 mg/dL (ref 0.44–1.00)
Glucose, Bld: 126 mg/dL — ABNORMAL HIGH (ref 65–99)
Potassium: 2.7 mmol/L — CL (ref 3.5–5.1)
SODIUM: 136 mmol/L (ref 135–145)
Total Bilirubin: 0.4 mg/dL (ref 0.3–1.2)
Total Protein: 6 g/dL — ABNORMAL LOW (ref 6.5–8.1)

## 2015-04-03 LAB — CBC WITH DIFFERENTIAL/PLATELET
BASOS ABS: 0 10*3/uL (ref 0.0–0.1)
BASOS PCT: 0 %
Eosinophils Absolute: 0 10*3/uL (ref 0.0–0.7)
Eosinophils Relative: 1 %
HCT: 11.7 % — ABNORMAL LOW (ref 36.0–46.0)
Hemoglobin: 3.8 g/dL — CL (ref 12.0–15.0)
LYMPHS PCT: 24 %
Lymphs Abs: 1 10*3/uL (ref 0.7–4.0)
MCH: 24.2 pg — ABNORMAL LOW (ref 26.0–34.0)
MCHC: 32.5 g/dL (ref 30.0–36.0)
MCV: 74.5 fL — AB (ref 78.0–100.0)
Monocytes Absolute: 0.7 10*3/uL (ref 0.1–1.0)
Monocytes Relative: 18 %
Neutro Abs: 2.4 10*3/uL (ref 1.7–7.7)
Neutrophils Relative %: 57 %
PLATELETS: 42 10*3/uL — AB (ref 150–400)
RBC: 1.57 MIL/uL — ABNORMAL LOW (ref 3.87–5.11)
RDW: 18.5 % — ABNORMAL HIGH (ref 11.5–15.5)
WBC MORPHOLOGY: INCREASED
WBC: 4.1 10*3/uL (ref 4.0–10.5)

## 2015-04-03 LAB — PROTIME-INR
INR: 1.2 (ref 0.00–1.49)
PROTHROMBIN TIME: 15.3 s — AB (ref 11.6–15.2)

## 2015-04-03 LAB — PREPARE RBC (CROSSMATCH)

## 2015-04-03 LAB — APTT: APTT: 39 s — AB (ref 24–37)

## 2015-04-03 MED ORDER — POTASSIUM CHLORIDE CRYS ER 20 MEQ PO TBCR
40.0000 meq | EXTENDED_RELEASE_TABLET | Freq: Once | ORAL | Status: AC
  Administered 2015-04-03: 40 meq via ORAL
  Filled 2015-04-03: qty 2

## 2015-04-03 MED ORDER — POTASSIUM CHLORIDE 10 MEQ/100ML IV SOLN: 10.0000 meq | Freq: Once | INTRAVENOUS | Status: DC

## 2015-04-03 MED ORDER — SODIUM CHLORIDE 0.9 % IV SOLN: INTRAVENOUS | Status: AC

## 2015-04-03 MED ORDER — SODIUM CHLORIDE 0.9 % IV SOLN
Freq: Once | INTRAVENOUS | Status: AC
  Administered 2015-04-03: 21:00:00 via INTRAVENOUS

## 2015-04-03 MED ORDER — ONDANSETRON HCL 4 MG/2ML IJ SOLN: 4.0000 mg | Freq: Three times a day (TID) | INTRAMUSCULAR | Status: DC | PRN

## 2015-04-03 NOTE — ED Notes (Signed)
Pt has bladder cancer and has been having hematuria for three days, Dr Zigmund Daniel told her to come here for further evaluation

## 2015-04-03 NOTE — ED Provider Notes (Signed)
CSN: [id number]     Arrival date & time 04/03/15  2005 History   First MD Initiated Contact with Patient 04/03/15 2013     Chief Complaint  Patient presents with  . Hematuria     (Consider location/radiation/quality/duration/timing/severity/associated sxs/prior Treatment) HPI Comments: The patient is a 59 year old female, she has advanced bladder cancer, is on palliative care, still getting palliative chemotherapy, Dr. Zigmund Daniel called ahead to tell us that the patient was coming in for blood transfusion due to symptomatic anemia. Her last blood transfusion was 3 weeks ago. She has chronic hematuria secondary to her bladder cancer and has been getting more lightheaded and slightly short of breath. Symptoms are persistent, gradually worsening, they're now severe  Patient is a 59 y.o. female presenting with hematuria. The history is provided by the patient.  Hematuria    Past Medical History  Diagnosis Date  . Hypertension   . History of uterine fibroid   . Bladder tumor   . Gross hematuria   . Bladder spasm   . Wears contact lenses    Past Surgical History  Procedure Laterality Date  . Abdominal hysterectomy  2000  . Colposcopy    . Transurethral resection of bladder tumor with mitomycin-c N/A 06/13/2013    Procedure: CYSTOSCOPY TRANSURETHERAL RESECTION BLADDER TUMOR BILATERAL  RETROGRADE PYLEOGRAM INSULATION OF MITOMYCON C;  Surgeon: Ardis Hughs, MD;  Location: [facility];  Service: Urology;  Laterality: N/A;  . Cystoscopy w/ retrogrades N/A 06/13/2013    Procedure: CYSTOSCOPY WITH RETROGRADE PYELOGRAM;  Surgeon: Ardis Hughs, MD;  Location: [facility];  Service: Urology;  Laterality: N/A;   Family History  Problem Relation Age of Onset  . Epilepsy Mother     temporal lobe epilepsy  . Emphysema Mother   . Cancer Mother     lung  . Hypertension Mother   . Heart attack Father    Social History  Substance Use Topics  . Smoking  status: Former Smoker -- 1.00 packs/day for 24 years    Types: Cigarettes    Quit date: 06/11/1998  . Smokeless tobacco: Never Used  . Alcohol Use: No   OB History    No data available     Review of Systems  Genitourinary: Positive for hematuria.  All other systems reviewed and are negative.     Allergies  Adhesive  Home Medications   Prior to Admission medications   Medication Sig Start Date End Date Taking? Authorizing Provider  Cholecalciferol (VITAMIN D3) 1000 UNITS CAPS Take 1 capsule by mouth daily.   Yes Historical Provider, MD  Docusate Sodium (STOOL SOFTENER) 100 MG capsule Take 100 mg by mouth 2 (two) times daily.   Yes Historical Provider, MD  hydrochlorothiazide (HYDRODIURIL) 25 MG tablet TAKE ONE TABLET BY MOUTH ONCE DAILY 08/04/14  Yes Debbrah Alar, NP  ibuprofen (ADVIL,MOTRIN) 200 MG tablet Take 200 mg by mouth every 6 (six) hours as needed for mild pain or moderate pain.   Yes Historical Provider, MD  LORazepam (ATIVAN) 0.5 MG tablet Take 0.5 mg by mouth every 6 (six) hours as needed for anxiety.   Yes Historical Provider, MD  losartan (COZAAR) 50 MG tablet TAKE ONE TABLET BY MOUTH ONCE DAILY 08/04/14  Yes Debbrah Alar, NP  milk thistle 175 MG tablet Take 175 mg by mouth daily as needed.    Yes Historical Provider, MD  OVER THE [place].  1 dropper per day.   Yes Historical Provider, MD  OVER THE COUNTER MEDICATION Apply topically. LEMON GRASS ESSENTIAL OILS.  Once a day.   Yes Historical Provider, MD  OVER THE COUNTER MEDICATION [place].  2 drops under tongue three times daily.   Yes Historical Provider, MD  OVER THE COUNTER MEDICATION JUNIPER BERRY AND GRAPEFRUIT.  Mix together take in 1 veggie capsule daily.   Yes Historical Provider, MD  OVER THE COUNTER MEDICATION [place].  Place in capsule and take once to twice a day.   Yes Historical Provider, MD  OVER THE COUNTER MEDICATION LAVENDER AND YLANG YLANG. Apply to  bottom of feet once a day.   Yes Historical Provider, MD  oxyCODONE (OXY IR/ROXICODONE) 5 MG immediate release tablet Take 1 tablet (5 mg total) by mouth every 4 (four) hours as needed for severe pain. 11/02/14  Yes Debbrah Alar, NP  oxyCODONE (OXYCONTIN) 20 mg 12 hr tablet Take 20 mg by mouth every 8 (eight) hours. 03/15/15  Yes Historical Provider, MD  Oxycodone HCl 10 MG TABS Take 10 mg by mouth every 4 (four) hours as needed (For up to 7 days.).   Yes Historical Provider, MD  polyethylene glycol (MIRALAX / GLYCOLAX) packet Take 17 g by mouth daily.   Yes Historical Provider, MD  [place] [facility].  Oncologist Tessie Eke.   Yes Historical Provider, MD  sennosides-docusate sodium (SENOKOT-S) 8.6-50 MG tablet Take 1 tablet by mouth 2 (two) times daily.   Yes Historical Provider, MD  vitamin E 400 UNIT capsule Take 400 Units by mouth daily.   Yes Historical Provider, MD   BP 111/67 mmHg  Pulse 98  Temp(Src) 97.7 F (36.5 C) (Oral)  Resp 18  SpO2 100% Physical Exam  Constitutional: She appears well-developed and well-nourished. No distress.  HENT:  Head: Normocephalic and atraumatic.  Mouth/Throat: Oropharynx is clear and moist. No oropharyngeal exudate.  Eyes: EOM are normal. Pupils are equal, round, and reactive to light. Right eye exhibits no discharge. Left eye exhibits no discharge. No scleral icterus.  Pale conjunctiva  Neck: Normal range of motion. Neck supple. No JVD present. No thyromegaly present.  Cardiovascular: Regular rhythm, normal heart sounds and intact distal pulses.  Exam reveals no gallop and no friction rub.   No murmur heard. Tachycardia  Pulmonary/Chest: Effort normal and breath sounds normal. No respiratory distress. She has no wheezes. She has no rales.  Abdominal: Soft. Bowel sounds are normal. She exhibits no distension and no mass. There is no tenderness.  Musculoskeletal: Normal range of motion. She exhibits no edema or tenderness.   Lymphadenopathy:    She has no cervical adenopathy.  Neurological: She is alert. Coordination normal.  Skin: Skin is warm and dry. No rash noted. No erythema.  Pale nailbeds  Psychiatric: She has a normal mood and affect. Her behavior is normal.  Nursing note and vitals reviewed.   ED Course  Procedures (including critical care time) Labs Review Labs Reviewed  CBC WITH DIFFERENTIAL/PLATELET - Abnormal; Notable for the following:    RBC 1.57 (*)    Hemoglobin 3.8 (*)    HCT 11.7 (*)    MCV 74.5 (*)    MCH 24.2 (*)    RDW 18.5 (*)    Platelets 42 (*)    All other components within normal limits  APTT - Abnormal; Notable for the following:    aPTT 39 (*)    All other components within normal limits  PROTIME-INR - Abnormal; Notable for the following:    Prothrombin  Time 15.3 (*)    All other components within normal limits  COMPREHENSIVE METABOLIC PANEL - Abnormal; Notable for the following:    Potassium 2.7 (*)    Glucose, Bld 126 (*)    Calcium 8.8 (*)    Total Protein 6.0 (*)    Albumin 3.2 (*)    All other components within normal limits  TYPE AND SCREEN  PREPARE RBC (CROSSMATCH)  ABO/RH    Imaging Review No results found. I have personally reviewed and evaluated these images and lab results as part of my medical decision-making.    MDM   Final diagnoses:  Severe anemia  Thrombocytopenia (HCC)  Hypokalemia    mild tachycardia, patient appears pale as if she is anemic  At request of Dr. Zigmund Daniel will transfuse and admit  Labs pending.    Severe anemia Low K Low platelets Persistent tachycardia Needs blood in ED - transfusion  Admit.  CRITICAL CARE Performed by: Johnna Acosta Total critical care time: 35 minutes Critical care time was exclusive of separately billable procedures and treating other patients. Critical care was necessary to treat or prevent imminent or life-threatening deterioration. Critical care was time spent personally by me  on the following activities: development of treatment plan with patient and/or surrogate as well as nursing, discussions with consultants, evaluation of patient's response to treatment, examination of patient, obtaining history from patient or surrogate, ordering and performing treatments and interventions, ordering and review of laboratory studies, ordering and review of radiographic studies, pulse oximetry and re-evaluation of patient's condition.   Noemi Chapel, MD 04/03/15 [phone]

## 2015-04-04 ENCOUNTER — Encounter (HOSPITAL_COMMUNITY): Payer: Self-pay | Admitting: Internal Medicine

## 2015-04-04 ENCOUNTER — Observation Stay (HOSPITAL_COMMUNITY): Payer: Medicaid Other

## 2015-04-04 DIAGNOSIS — Z825 Family history of asthma and other chronic lower respiratory diseases: Secondary | ICD-10-CM | POA: Diagnosis not present

## 2015-04-04 DIAGNOSIS — F419 Anxiety disorder, unspecified: Secondary | ICD-10-CM | POA: Diagnosis present

## 2015-04-04 DIAGNOSIS — R011 Cardiac murmur, unspecified: Secondary | ICD-10-CM | POA: Diagnosis not present

## 2015-04-04 DIAGNOSIS — I10 Essential (primary) hypertension: Secondary | ICD-10-CM | POA: Diagnosis present

## 2015-04-04 DIAGNOSIS — C7951 Secondary malignant neoplasm of bone: Secondary | ICD-10-CM | POA: Diagnosis present

## 2015-04-04 DIAGNOSIS — Z801 Family history of malignant neoplasm of trachea, bronchus and lung: Secondary | ICD-10-CM | POA: Diagnosis not present

## 2015-04-04 DIAGNOSIS — Z820 Family history of epilepsy and other diseases of the nervous system: Secondary | ICD-10-CM | POA: Diagnosis not present

## 2015-04-04 DIAGNOSIS — D649 Anemia, unspecified: Secondary | ICD-10-CM | POA: Diagnosis present

## 2015-04-04 DIAGNOSIS — Z87891 Personal history of nicotine dependence: Secondary | ICD-10-CM | POA: Diagnosis not present

## 2015-04-04 DIAGNOSIS — R310 Gross hematuria: Secondary | ICD-10-CM | POA: Diagnosis present

## 2015-04-04 DIAGNOSIS — E876 Hypokalemia: Secondary | ICD-10-CM | POA: Diagnosis present

## 2015-04-04 DIAGNOSIS — Z8249 Family history of ischemic heart disease and other diseases of the circulatory system: Secondary | ICD-10-CM | POA: Diagnosis not present

## 2015-04-04 DIAGNOSIS — D62 Acute posthemorrhagic anemia: Secondary | ICD-10-CM | POA: Diagnosis present

## 2015-04-04 DIAGNOSIS — D6959 Other secondary thrombocytopenia: Secondary | ICD-10-CM | POA: Diagnosis present

## 2015-04-04 DIAGNOSIS — Z79899 Other long term (current) drug therapy: Secondary | ICD-10-CM | POA: Diagnosis not present

## 2015-04-04 DIAGNOSIS — C679 Malignant neoplasm of bladder, unspecified: Secondary | ICD-10-CM | POA: Diagnosis present

## 2015-04-04 DIAGNOSIS — G893 Neoplasm related pain (acute) (chronic): Secondary | ICD-10-CM | POA: Diagnosis present

## 2015-04-04 DIAGNOSIS — T451X5A Adverse effect of antineoplastic and immunosuppressive drugs, initial encounter: Secondary | ICD-10-CM | POA: Diagnosis present

## 2015-04-04 DIAGNOSIS — Z9109 Other allergy status, other than to drugs and biological substances: Secondary | ICD-10-CM | POA: Diagnosis not present

## 2015-04-04 DIAGNOSIS — Z906 Acquired absence of other parts of urinary tract: Secondary | ICD-10-CM | POA: Diagnosis not present

## 2015-04-04 DIAGNOSIS — Z90710 Acquired absence of both cervix and uterus: Secondary | ICD-10-CM | POA: Diagnosis not present

## 2015-04-04 DIAGNOSIS — D509 Iron deficiency anemia, unspecified: Secondary | ICD-10-CM | POA: Diagnosis present

## 2015-04-04 LAB — CBC WITH DIFFERENTIAL/PLATELET
Basophils Absolute: 0 10*3/uL (ref 0.0–0.1)
Basophils Relative: 0 %
Eosinophils Absolute: 0 10*3/uL (ref 0.0–0.7)
Eosinophils Relative: 0 %
HCT: 23.8 % — ABNORMAL LOW (ref 36.0–46.0)
Hemoglobin: 7.9 g/dL — ABNORMAL LOW (ref 12.0–15.0)
Lymphocytes Relative: 24 %
Lymphs Abs: 1.3 10*3/uL (ref 0.7–4.0)
MCH: 25.8 pg — ABNORMAL LOW (ref 26.0–34.0)
MCHC: 33.2 g/dL (ref 30.0–36.0)
MCV: 77.8 fL — ABNORMAL LOW (ref 78.0–100.0)
Monocytes Absolute: 1 10*3/uL (ref 0.1–1.0)
Monocytes Relative: 18 %
Neutro Abs: 3.1 10*3/uL (ref 1.7–7.7)
Neutrophils Relative %: 58 %
Platelets: 42 10*3/uL — ABNORMAL LOW (ref 150–400)
RBC: 3.06 MIL/uL — ABNORMAL LOW (ref 3.87–5.11)
RDW: 17 % — ABNORMAL HIGH (ref 11.5–15.5)
WBC: 5.4 10*3/uL (ref 4.0–10.5)

## 2015-04-04 LAB — COMPREHENSIVE METABOLIC PANEL WITH GFR
ALT: 16 U/L (ref 14–54)
AST: 24 U/L (ref 15–41)
Albumin: 3.2 g/dL — ABNORMAL LOW (ref 3.5–5.0)
Alkaline Phosphatase: 82 U/L (ref 38–126)
Anion gap: 8 (ref 5–15)
BUN: 9 mg/dL (ref 6–20)
CO2: 27 mmol/L (ref 22–32)
Calcium: 8.5 mg/dL — ABNORMAL LOW (ref 8.9–10.3)
Chloride: 102 mmol/L (ref 101–111)
Creatinine, Ser: 0.9 mg/dL (ref 0.44–1.00)
GFR calc Af Amer: >60 mL/min
GFR calc non Af Amer: >60 mL/min
Glucose, Bld: 140 mg/dL — ABNORMAL HIGH (ref 65–99)
Potassium: 3.5 mmol/L (ref 3.5–5.1)
Sodium: 137 mmol/L (ref 135–145)
Total Bilirubin: 1.2 mg/dL (ref 0.3–1.2)
Total Protein: 5.9 g/dL — ABNORMAL LOW (ref 6.5–8.1)

## 2015-04-04 LAB — MAGNESIUM: Magnesium: 1.5 mg/dL — ABNORMAL LOW (ref 1.7–2.4)

## 2015-04-04 LAB — PHOSPHORUS: Phosphorus: 3.2 mg/dL (ref 2.5–4.6)

## 2015-04-04 MED ORDER — LOSARTAN POTASSIUM 50 MG PO TABS
50.0000 mg | ORAL_TABLET | Freq: Every day | ORAL | Status: DC
  Administered 2015-04-04 – 2015-04-05 (×2): 50 mg via ORAL
  Filled 2015-04-04 (×2): qty 1

## 2015-04-04 MED ORDER — POTASSIUM CHLORIDE IN NACL 40-0.9 MEQ/L-% IV SOLN
INTRAVENOUS | Status: AC
  Filled 2015-04-04: qty 1000

## 2015-04-04 MED ORDER — SODIUM CHLORIDE 0.9 % IJ SOLN
10.0000 mL | INTRAMUSCULAR | Status: DC | PRN
Start: 2015-04-04 — End: 2015-04-05
  Administered 2015-04-05: 10 mL
  Filled 2015-04-04: qty 40

## 2015-04-04 MED ORDER — ONDANSETRON HCL 4 MG PO TABS
4.0000 mg | ORAL_TABLET | Freq: Four times a day (QID) | ORAL | Status: DC | PRN
  Administered 2015-04-04: 4 mg via ORAL
  Filled 2015-04-04: qty 1

## 2015-04-04 MED ORDER — DOCUSATE SODIUM 100 MG PO CAPS
100.0000 mg | ORAL_CAPSULE | Freq: Two times a day (BID) | ORAL | Status: DC
  Administered 2015-04-04 – 2015-04-05 (×4): 100 mg via ORAL
  Filled 2015-04-04 (×5): qty 1

## 2015-04-04 MED ORDER — LORAZEPAM 0.5 MG PO TABS
0.5000 mg | ORAL_TABLET | Freq: Four times a day (QID) | ORAL | Status: DC | PRN
  Administered 2015-04-04 – 2015-04-05 (×2): 0.5 mg via ORAL
  Filled 2015-04-04 (×2): qty 1

## 2015-04-04 MED ORDER — SENNOSIDES-DOCUSATE SODIUM 8.6-50 MG PO TABS
1.0000 | ORAL_TABLET | Freq: Two times a day (BID) | ORAL | Status: DC
  Administered 2015-04-04 – 2015-04-05 (×4): 1 via ORAL
  Filled 2015-04-04 (×5): qty 1

## 2015-04-04 MED ORDER — ONDANSETRON HCL 4 MG/2ML IJ SOLN: 4.0000 mg | Freq: Four times a day (QID) | INTRAMUSCULAR | Status: DC | PRN

## 2015-04-04 MED ORDER — HYDROMORPHONE HCL 1 MG/ML IJ SOLN
1.0000 mg | INTRAMUSCULAR | Status: DC | PRN
  Administered 2015-04-04 (×3): 2 mg via INTRAVENOUS
  Administered 2015-04-05 (×2): 1 mg via INTRAVENOUS
  Administered 2015-04-05: 2 mg via INTRAVENOUS
  Filled 2015-04-04 (×4): qty 2
  Filled 2015-04-04 (×2): qty 1

## 2015-04-04 MED ORDER — HYDROCHLOROTHIAZIDE 25 MG PO TABS
25.0000 mg | ORAL_TABLET | Freq: Every day | ORAL | Status: DC
  Administered 2015-04-04 – 2015-04-05 (×2): 25 mg via ORAL
  Filled 2015-04-04 (×2): qty 1

## 2015-04-04 MED ORDER — VITAMIN D3 25 MCG (1000 UNIT) PO TABS
1000.0000 [IU] | ORAL_TABLET | Freq: Every day | ORAL | Status: DC
  Administered 2015-04-04 – 2015-04-05 (×2): 1000 [IU] via ORAL
  Filled 2015-04-04 (×2): qty 1

## 2015-04-04 MED ORDER — OXYCODONE HCL 5 MG PO TABS
10.0000 mg | ORAL_TABLET | ORAL | Status: DC | PRN
  Administered 2015-04-04 – 2015-04-05 (×7): 10 mg via ORAL
  Filled 2015-04-04 (×8): qty 2

## 2015-04-04 MED ORDER — POLYETHYLENE GLYCOL 3350 17 G PO PACK
17.0000 g | PACK | Freq: Every day | ORAL | Status: DC
  Administered 2015-04-04 – 2015-04-05 (×2): 17 g via ORAL
  Filled 2015-04-04 (×2): qty 1

## 2015-04-04 MED ORDER — HYDROMORPHONE HCL 1 MG/ML IJ SOLN
1.0000 mg | INTRAMUSCULAR | Status: DC | PRN
  Administered 2015-04-04 (×2): 1 mg via INTRAVENOUS
  Filled 2015-04-04 (×2): qty 1

## 2015-04-04 MED ORDER — OXYCODONE HCL ER 20 MG PO T12A
20.0000 mg | EXTENDED_RELEASE_TABLET | Freq: Three times a day (TID) | ORAL | Status: DC
  Administered 2015-04-04 – 2015-04-05 (×5): 20 mg via ORAL
  Filled 2015-04-04 (×5): qty 1

## 2015-04-04 MED ORDER — VITAMIN E 180 MG (400 UNIT) PO CAPS
400.0000 [IU] | ORAL_CAPSULE | Freq: Every day | ORAL | Status: DC
  Administered 2015-04-04 – 2015-04-05 (×2): 400 [IU] via ORAL
  Filled 2015-04-04 (×2): qty 1

## 2015-04-04 MED ORDER — ENSURE ENLIVE PO LIQD
237.0000 mL | Freq: Two times a day (BID) | ORAL | Status: DC
  Administered 2015-04-04 – 2015-04-05 (×4): 237 mL via ORAL

## 2015-04-04 MED ORDER — POTASSIUM CHLORIDE IN NACL 40-0.9 MEQ/L-% IV SOLN
INTRAVENOUS | Status: DC
  Filled 2015-04-04: qty 1000

## 2015-04-04 MED ORDER — IBUPROFEN 400 MG PO TABS
400.0000 mg | ORAL_TABLET | Freq: Once | ORAL | Status: AC
Start: 2015-04-04 — End: 2015-04-04
  Administered 2015-04-04: 400 mg via ORAL
  Filled 2015-04-04: qty 1

## 2015-04-04 MED ORDER — SODIUM CHLORIDE 0.9 % IJ SOLN
3.0000 mL | Freq: Two times a day (BID) | INTRAMUSCULAR | Status: DC
  Administered 2015-04-04: 3 mL via INTRAVENOUS
  Administered 2015-04-04: 10 mL via INTRAVENOUS
  Administered 2015-04-05: 3 mL via INTRAVENOUS

## 2015-04-04 MED ORDER — POTASSIUM CHLORIDE CRYS ER 20 MEQ PO TBCR
20.0000 meq | EXTENDED_RELEASE_TABLET | Freq: Two times a day (BID) | ORAL | Status: DC
  Administered 2015-04-04 – 2015-04-05 (×4): 20 meq via ORAL
  Filled 2015-04-04 (×5): qty 1

## 2015-04-04 MED ORDER — SODIUM CHLORIDE 0.9 % IV SOLN: Freq: Once | INTRAVENOUS | Status: AC

## 2015-04-04 MED ORDER — CIPROFLOXACIN IN D5W 400 MG/200ML IV SOLN
400.0000 mg | Freq: Two times a day (BID) | INTRAVENOUS | Status: DC
  Administered 2015-04-04 – 2015-04-05 (×2): 400 mg via INTRAVENOUS
  Filled 2015-04-04 (×4): qty 200

## 2015-04-04 NOTE — Progress Notes (Signed)
Utilization Review Completed.Eliazer Hemphill T1/04/2015  

## 2015-04-04 NOTE — Progress Notes (Signed)
*  PRELIMINARY RESULTS* Echocardiogram 2D Echocardiogram has been performed.  Ruth Maldonado 04/04/2015, 2:49 PM

## 2015-04-04 NOTE — Progress Notes (Signed)
[facility] PROGRESS NOTE  Ruth Maldonado [id number] DOB: 08/15/1955 DOA: 04/03/2015  PCP: Nance Pear., NP  Brief HPI: 60 year old African-American female with a past medical history of bladder cancer, receiving chemotherapy at [facility] who underwent TUBRT at [facility] in October. She has metastatic lesions. She presented with significant hematuria ongoing for 3 days prior to admission. She was found to have hemoglobin of 3.8. She was hospitalized for further management.  Past medical history:  Past Medical History  Diagnosis Date  . Hypertension   . History of uterine fibroid   . Bladder tumor   . Gross hematuria   . Bladder spasm   . Wears contact lenses     Consultants: None  Procedures: Blood transfusion of 4 units  Antibiotics: Ciprofloxacin  Subjective: Patient takes that the amount of bleeding in early urine appears to be subsiding. Her urine is clearing up. She hasn't passed any blood clots since last night. Continues to have pain in her lower abdomen as well as in her lower back. This is a chronic pain from her cancer.  Objective: Vital Signs  Filed Vitals:   04/04/15 0617 04/04/15 0945 04/04/15 1020 04/04/15 1045  BP: 114/57 137/76 123/71 123/70  Pulse: 93 98 82 84  Temp: 98.4 F (36.9 C) 97.5 F (36.4 C) 98.4 F (36.9 C) 98.1 F (36.7 C)  TempSrc: Oral Oral Oral Oral  Resp: 16 16 16 16   SpO2: 100% 100% 100% 100%    Intake/Output Summary (Last 24 hours) at 04/04/15 1409 Last data filed at 04/04/15 1350  Gross per 24 hour  Intake   2950 ml  Output      0 ml  Net   2950 ml   There were no vitals filed for this visit.  General appearance: alert, cooperative, appears stated age and no distress Resp: clear to auscultation bilaterally Cardio: regular rate and rhythm, S1, S2 normal, no murmur, click, rub or gallop GI: Abdomen is soft. Tender in the suprapubic area. No rebound, rigidity or guarding. No masses or  organomegaly. Bowel sounds present. Extremities: extremities normal, atraumatic, no cyanosis or edema Neurologic: Alert and oriented X 3, normal strength and tone. Normal symmetric reflexes. Normal coordination and gait  Lab Results:  Basic Metabolic Panel:  Recent Labs Lab 04/03/15 2040 04/04/15 0900  NA 136 137  K 2.7* 3.5  CL 101 102  CO2 26 27  GLUCOSE 126* 140*  BUN 10 9  CREATININE 0.91 0.90  CALCIUM 8.8* 8.5*  MG 1.5*  --   PHOS 3.2  --    Liver Function Tests:  Recent Labs Lab 04/03/15 2040 04/04/15 0900  AST 20 24  ALT 15 16  ALKPHOS 86 82  BILITOT 0.4 1.2  PROT 6.0* 5.9*  ALBUMIN 3.2* 3.2*   CBC:  Recent Labs Lab 04/03/15 2040 04/04/15 0900  WBC 4.1 5.4  NEUTROABS 2.4 3.1  HGB 3.8* 7.9*  HCT 11.7* 23.8*  MCV 74.5* 77.8*  PLT 42* 42*      Studies/Results: No results found.  Medications:  Scheduled: . sodium chloride   Intravenous STAT  . cholecalciferol  1,000 Units Oral Daily  . ciprofloxacin  400 mg Intravenous Q12H  . docusate sodium  100 mg Oral BID  . feeding supplement (ENSURE ENLIVE)  237 mL Oral BID BM  . hydrochlorothiazide  25 mg Oral Daily  . losartan  50 mg Oral Daily  . oxyCODONE  20 mg Oral 3 times per day  .  polyethylene glycol  17 g Oral Daily  . potassium chloride  10 mEq Intravenous Once  . potassium chloride  20 mEq Oral BID  . senna-docusate  1 tablet Oral BID  . sodium chloride  3 mL Intravenous Q12H  . vitamin E  400 Units Oral Daily   Continuous:  [id number] (DILAUDID) injection, LORazepam, ondansetron **OR** ondansetron (ZOFRAN) IV, oxyCODONE, sodium chloride  Assessment/Plan:  Principal Problem:   Severe anemia Active Problems:   Bladder cancer (HCC)   HTN (hypertension)   Hypokalemia   Gross hematuria    Severe microcytic anemia/acute blood loss Patient was transfused 4 units of PRBCs. Hemoglobin appears to be responding appropriately. Anemia is secondary to GU blood loss. Patient has a  known history of bladder cancer and has been experiencing hematuria.  Gross hematuria in a patient with known history of metastatic bladder cancer Per patient, bleeding appears to be subsiding. She gets care for her cancer at [facility]. She last saw a urologist in [place] about 2 years ago. She underwent transurethral resection of her bladder cancer in October at [facility] Continue to monitor for now. If her bleeding doesn't subside, we will discuss with urology as to further options. She gets chemotherapy at [facility] as well.  History of essential hypertension. Blood pressure is reasonably well controlled. Continue with her home medications.  Hypokalemia Corrected. Continue to monitor.  Cancer related pain Continue with her long-acting OxyContin along with oxycodone for breakthrough pain. Utilize Dilaudid intravenously as needed.  DVT Prophylaxis: SCDs    Code Status: Full code  Family Communication: Discussed with the patient  Disposition Plan: Continue current management. Mobilize. Await for bleeding to subside.       [facility] Pager [phone] 04/04/2015, 2:09 PM  If 7PM-7AM, please contact night-coverage at [email or web address], password [facility]

## 2015-04-04 NOTE — Progress Notes (Signed)
Patient c/o left pain/weakness in left leg while returning from the bathroom.  Staff had to assist patient back to chair.  PRN pain meds given.  Patient resting in chair at this time.  Virginia Rochester, RN

## 2015-04-04 NOTE — Progress Notes (Signed)
A few small clots noted in urine at this time.

## 2015-04-04 NOTE — H&P (Signed)
[facility] History and Physical  Ruth Maldonado [id number] DOB: 02-May-1955 DOA: 04/03/2015  Referring physician: Noemi Chapel, M.D. PCP: Nance Pear., NP   Chief Complaint: Hematuria for 3 days.  HPI: Ruth Maldonado is a 60 y.o. female with a past medical history of bladder cancer (on palliative chemotherapy), hypertension who comes to the emergency department due to significant hematuria for the past 3 days. She has chronic hematuria and last received a blood transfusion about 3 weeks ago. However, over the last 3 days, she has seen increased blood and clots when she urinates. She called Dr. Zigmund Daniel earlier today who advised her to come to the emergency department and call Dr. Sabra Heck in the emergency department to notify him of patient's arrival. She denies fever, chills, but feels fatigued, lightheaded and felt some palpitations earlier today, which she attributed to anxiety.. She denies flank pain, but complains of occasional dysuria, frequency and urgency.   When seen in the ED, she was in no acute distress. Her workup reveals gross hematuria and a hemoglobin level of 3.8.    Review of Systems:  Constitutional:  Positive fatigue.  No weight loss, night sweats, Fevers, chills.  HEENT:  No headaches, Difficulty swallowing,Tooth/dental problems, Sore throat,  No sneezing, itching, ear ache, nasal congestion, post nasal drip,  Cardio-vascular:  Positive dizziness, palpitations  No chest pain, Orthopnea, PND, swelling in lower extremities, anasarca,  GI:  No heartburn, indigestion, abdominal pain, nausea, vomiting, diarrhea, change in bowel habits, loss of appetite  Resp:  Mild dyspnea. Denies cough, hemoptysis or wheezing. Skin:  no rash or lesions.  GU:  Mild dysuria, positive change in color of urine due to hematuria, + urgency or frequency. No flank pain.  Musculoskeletal:  No joint pain or swelling. No decreased range of motion. No back pain.    Psych:  No change in mood or affect. No depression or anxiety. No memory loss.   Past Medical History  Diagnosis Date  . Hypertension   . History of uterine fibroid   . Bladder tumor   . Gross hematuria   . Bladder spasm   . Wears contact lenses    Past Surgical History  Procedure Laterality Date  . Abdominal hysterectomy  2000  . Colposcopy    . Transurethral resection of bladder tumor with mitomycin-c N/A 06/13/2013    Procedure: CYSTOSCOPY TRANSURETHERAL RESECTION BLADDER TUMOR BILATERAL  RETROGRADE PYLEOGRAM INSULATION OF MITOMYCON C;  Surgeon: Ardis Hughs, MD;  Location: [facility];  Service: Urology;  Laterality: N/A;  . Cystoscopy w/ retrogrades N/A 06/13/2013    Procedure: CYSTOSCOPY WITH RETROGRADE PYELOGRAM;  Surgeon: Ardis Hughs, MD;  Location: [facility];  Service: Urology;  Laterality: N/A;   Social History:  reports that she quit smoking about 16 years ago. Her smoking use included Cigarettes. She has a 24 pack-year smoking history. She has never used smokeless tobacco. She reports that she does not drink alcohol or use illicit drugs.  Allergies  Allergen Reactions  . Adhesive [Tape] Itching    Family History  Problem Relation Age of Onset  . Epilepsy Mother     temporal lobe epilepsy  . Emphysema Mother   . Cancer Mother     lung  . Hypertension Mother   . Heart attack Father     Prior to Admission medications   Medication Sig Start Date End Date Taking? Authorizing Provider  Cholecalciferol (VITAMIN D3) 1000 UNITS CAPS Take 1 capsule by mouth daily.  Yes Historical Provider, MD  Docusate Sodium (STOOL SOFTENER) 100 MG capsule Take 100 mg by mouth 2 (two) times daily.   Yes Historical Provider, MD  hydrochlorothiazide (HYDRODIURIL) 25 MG tablet TAKE ONE TABLET BY MOUTH ONCE DAILY 08/04/14  Yes Debbrah Alar, NP  ibuprofen (ADVIL,MOTRIN) 200 MG tablet Take 200 mg by mouth every 6 (six) hours as needed for mild  pain or moderate pain.   Yes Historical Provider, MD  LORazepam (ATIVAN) 0.5 MG tablet Take 0.5 mg by mouth every 6 (six) hours as needed for anxiety.   Yes Historical Provider, MD  losartan (COZAAR) 50 MG tablet TAKE ONE TABLET BY MOUTH ONCE DAILY 08/04/14  Yes Debbrah Alar, NP  milk thistle 175 MG tablet Take 175 mg by mouth daily as needed.    Yes Historical Provider, MD  OVER THE [place].  1 dropper per day.   Yes Historical Provider, MD  OVER THE COUNTER MEDICATION Apply topically. LEMON GRASS ESSENTIAL OILS.  Once a day.   Yes Historical Provider, MD  OVER THE COUNTER MEDICATION [place].  2 drops under tongue three times daily.   Yes Historical Provider, MD  OVER THE COUNTER MEDICATION JUNIPER BERRY AND GRAPEFRUIT.  Mix together take in 1 veggie capsule daily.   Yes Historical Provider, MD  OVER THE COUNTER MEDICATION [place].  Place in capsule and take once to twice a day.   Yes Historical Provider, MD  OVER THE COUNTER MEDICATION LAVENDER AND YLANG YLANG. Apply to bottom of feet once a day.   Yes Historical Provider, MD  oxyCODONE (OXY IR/ROXICODONE) 5 MG immediate release tablet Take 1 tablet (5 mg total) by mouth every 4 (four) hours as needed for severe pain. 11/02/14  Yes Debbrah Alar, NP  oxyCODONE (OXYCONTIN) 20 mg 12 hr tablet Take 20 mg by mouth every 8 (eight) hours. 03/15/15  Yes Historical Provider, MD  Oxycodone HCl 10 MG TABS Take 10 mg by mouth every 4 (four) hours as needed (For up to 7 days.).   Yes Historical Provider, MD  polyethylene glycol (MIRALAX / GLYCOLAX) packet Take 17 g by mouth daily.   Yes Historical Provider, MD  [place] [facility]  Oncologist Tessie Eke.   Yes Historical Provider, MD  sennosides-docusate sodium (SENOKOT-S) 8.6-50 MG tablet Take 1 tablet by mouth 2 (two) times daily.   Yes Historical Provider, MD  vitamin E 400 UNIT capsule Take 400 Units by mouth daily.   Yes Historical Provider,  MD   Physical Exam: Filed Vitals:   04/03/15 2300 04/03/15 2301 04/03/15 2330 04/03/15 2359  BP: 132/76 132/76 143/81 134/68  Pulse: 112 112 121 115  Temp:  98.2 F (36.8 C)  97.8 F (36.6 C)  TempSrc:  Oral  Oral  Resp: 14 18 17 18   SpO2: 100% 100% 100% 100%    Wt Readings from Last 3 Encounters:  12/14/14 74.753 kg (164 lb 12.8 oz)  11/02/14 74.844 kg (165 lb)  10/30/14 75.116 kg (165 lb 9.6 oz)    General:  Appears calm and comfortable Eyes: PERRL, pale eyelids ENT: grossly normal hearing, lips & tongue Neck: no LAD, masses or thyromegaly Cardiovascular: RRR, no m/r/g. No LE edema. Telemetry: SR, no arrhythmias  Respiratory: CTA bilaterally, no w/r/r. Normal respiratory effort. Abdomen: Bowel sounds positive, soft, ntnd Skin: no rash or induration seen on limited exam Musculoskeletal: grossly normal tone BUE/BLE Psychiatric: grossly normal mood and affect, speech fluent and appropriate Neurologic: grossly non-focal.  Labs on Admission:  Basic Metabolic Panel:  Recent Labs Lab 04/03/15 2040  NA 136  K 2.7*  CL 101  CO2 26  GLUCOSE 126*  BUN 10  CREATININE 0.91  CALCIUM 8.8*   Liver Function Tests:  Recent Labs Lab 04/03/15 2040  AST 20  ALT 15  ALKPHOS 86  BILITOT 0.4  PROT 6.0*  ALBUMIN 3.2*   CBC:  Recent Labs Lab 04/03/15 2040  WBC 4.1  NEUTROABS 2.4  HGB 3.8*  HCT 11.7*  MCV 74.5*  PLT 42*     Assessment/Plan Principal Problem:   Severe anemia  Admit to telemetry. Continue packed RBC transfusion. 2 units were ordered down in the emergency department, but given the low hemoglobin level and the active hematuria, I will order 2 more units. Follow-up CBC in the morning. Consult urology in the morning.  Active Problems:   Gross hematuria   Bladder cancer ([place]) Continue blood transfusion as above mentioned. Urology evaluation in the morning. Oncology evaluation as needed or as scheduled.      HTN  (hypertension) Continue losartan 50 mg by mouth daily. Continue hydrochlorothiazide 25 mg by mouth daily. Start potassium supplementation on a regular basis.    Hypokalemia Continue replacement. Check magnesium level and replace if low. Follow-up potassium level in the morning. The patient should have  regular potassium supplementation.      Code Status: Full code. DVT Prophylaxis: Mechanical with SCDs. Family Communication: Her husband and son were by bedside. Disposition Plan: Admit for blood transfusion, potassium replacement and urology evaluation.  Time spent: Over 70 minutes were used in the process of this admission.  Reubin Milan [facility] Pager [phone].

## 2015-04-05 ENCOUNTER — Inpatient Hospital Stay (HOSPITAL_COMMUNITY): Payer: Medicaid Other

## 2015-04-05 LAB — CBC
HEMATOCRIT: 31 % — AB (ref 36.0–46.0)
HEMOGLOBIN: 10.8 g/dL — AB (ref 12.0–15.0)
MCH: 27.7 pg (ref 26.0–34.0)
MCHC: 34.8 g/dL (ref 30.0–36.0)
MCV: 79.5 fL (ref 78.0–100.0)
Platelets: 57 10*3/uL — ABNORMAL LOW (ref 150–400)
RBC: 3.9 MIL/uL (ref 3.87–5.11)
RDW: 16.3 % — ABNORMAL HIGH (ref 11.5–15.5)
WBC: 6.4 10*3/uL (ref 4.0–10.5)

## 2015-04-05 LAB — BASIC METABOLIC PANEL
Anion gap: 6 (ref 5–15)
BUN: 8 mg/dL (ref 6–20)
CALCIUM: 9.2 mg/dL (ref 8.9–10.3)
CHLORIDE: 106 mmol/L (ref 101–111)
CO2: 27 mmol/L (ref 22–32)
Creatinine, Ser: 0.8 mg/dL (ref 0.44–1.00)
GFR calc non Af Amer: >60 mL/min (ref >60)
GLUCOSE: 106 mg/dL — AB (ref 65–99)
POTASSIUM: 4 mmol/L (ref 3.5–5.1)
Sodium: 139 mmol/L (ref 135–145)

## 2015-04-05 MED ORDER — IBUPROFEN 600 MG PO TABS
600.0000 mg | ORAL_TABLET | Freq: Once | ORAL | Status: AC
  Administered 2015-04-05: 600 mg via ORAL
  Filled 2015-04-05: qty 1
  Filled 2015-04-05: qty 3

## 2015-04-05 MED ORDER — GI COCKTAIL ~~LOC~~
30.0000 mL | Freq: Once | ORAL | Status: AC
  Administered 2015-04-05: 30 mL via ORAL
  Filled 2015-04-05: qty 30

## 2015-04-05 MED ORDER — HEPARIN SOD (PORK) LOCK FLUSH 100 UNIT/ML IV SOLN
500.0000 [IU] | INTRAVENOUS | Status: DC | PRN
  Administered 2015-04-05: 500 [IU]
  Filled 2015-04-05 (×2): qty 5

## 2015-04-05 MED ORDER — HEPARIN SOD (PORK) LOCK FLUSH 100 UNIT/ML IV SOLN
500.0000 [IU] | INTRAVENOUS | Status: DC
  Filled 2015-04-05: qty 5

## 2015-04-05 MED ORDER — IBUPROFEN 400 MG PO TABS
400.0000 mg | ORAL_TABLET | Freq: Once | ORAL | Status: AC
  Administered 2015-04-05: 400 mg via ORAL
  Filled 2015-04-05: qty 1

## 2015-04-05 MED ORDER — OXYCODONE HCL 5 MG PO TABS: 5.0000 mg | ORAL_TABLET | ORAL | Status: DC | PRN

## 2015-04-05 NOTE — Evaluation (Signed)
Physical Therapy One Time Evaluation Patient Details Name: Ruth Maldonado MRN: [id number] DOB: 11/19/1955 Today's Date: 04/05/2015   History of Present Illness  60-year-old African-American female with a past medical history of bladder cancer, receiving chemotherapy at [facility] who underwent TUBRT at [facility] in October. She has metastatic lesions. She presented with significant hematuria and was found to have hemoglobin of 3.8.  Pt transfused with 4 units PRBCs.  Left femur xray "2.3 cm lytic lesion in the lesser trochanter, highly suggestive of metastatic disease."  Clinical Impression  Patient evaluated by Physical Therapy with no further acute PT needs identified. All education has been completed and the patient has no further questions.  Pt with L hip pain from lytic lesion and educated on gait training with RW for better pain control upon d/c.  Pt reports she has had lesion in L UE and had radiation which improved her pain so recommended she f/u with oncology MD for plan of care and then decide with MD when would be best for f/u PT. See below for any follow-up Physical Therapy or equipment needs. PT is signing off. Thank you for this referral.        Follow Up Recommendations Outpatient PT (recommended pt f/u with oncology MD first)    Equipment Recommendations  Rolling walker with 5" wheels;3in1 (PT)    Recommendations for Other Services       Precautions / Restrictions Precautions Precautions: Fall      Mobility  Bed Mobility Overal bed mobility: Needs Assistance Bed Mobility: Supine to Sit     Supine to sit: Supervision     General bed mobility comments: pt reports difficulty over past 2 days with moving L LE onto/off bed so provided verbal cues for self assist  Transfers Overall transfer level: Needs assistance Equipment used: Rolling walker (2 wheeled) Transfers: Sit to/from Stand Sit to Stand: Min guard         General transfer comment: verbal  cues for UE and LE positioning  Ambulation/Gait Ambulation/Gait assistance: Min guard Ambulation Distance (Feet): 15 Feet Assistive device: Rolling walker (2 wheeled) Gait Pattern/deviations: Step-to pattern;Step-through pattern;Antalgic;Decreased stance time - left     General Gait Details: initially educated to use step to pattern for pain control and pt reported no pain so encouraged step through pattern also utilizing RW to take UE weight and pt again reports no pain, small steps, slow pace, pt declined any further distance (did not wish to be in hallway and needed to use restroom)  Stairs Stairs:  (not performed however verbally educated on safe technique for 2 steps)          Wheelchair Mobility    Modified Rankin (Stroke Patients Only)       Balance                                             Pertinent Vitals/Pain Pain Assessment: 0-10 Pain Score: 6  (better at rest, 6/10 with movement) Pain Location: L hip Pain Descriptors / Indicators: Jabbing Pain Intervention(s): Limited activity within patient's tolerance;Monitored during session    Home Living Family/patient expects to be discharged to:: Private residence Living Arrangements: Spouse/significant other   Type of Home: House Home Access: Stairs to enter Entrance Stairs-Rails: Right Entrance Stairs-Number of Steps: 2 Home Layout: One level Home Equipment: None      Prior   Function Level of Independence: Independent               Hand Dominance        Extremity/Trunk Assessment               Lower Extremity Assessment: LLE deficits/detail   LLE Deficits / Details: able to perform ankle pumps, assist for bed mobility due to pain     Communication   Communication: No difficulties  Cognition Arousal/Alertness: Awake/alert Behavior During Therapy: WFL for tasks assessed/performed Overall Cognitive Status: Within Functional Limits for tasks assessed                       General Comments      Exercises        Assessment/Plan    PT Assessment All further PT needs can be met in the next venue of care  PT Diagnosis Difficulty walking   PT Problem List Decreased mobility;Pain;Decreased strength  PT Treatment Interventions     PT Goals (Current goals can be found in the Care Plan section) Acute Rehab PT Goals PT Goal Formulation: All assessment and education complete, DC therapy    Frequency     Barriers to discharge        Co-evaluation               End of Session Equipment Utilized During Treatment: Gait belt Activity Tolerance: Patient tolerated treatment well Patient left: Other (comment) (in bathroom, to use pull cord, NT aware) Nurse Communication: Mobility status         Time: 1351-1411 PT Time Calculation (min) (ACUTE ONLY): 20 min   Charges:   PT Evaluation $Initial PT Evaluation Tier I: 1 Procedure     PT G Codes:        LEMYRE,KATHrine E 04/05/2015, 3:29 PM Kati Lemyre, PT, DPT 04/05/2015 Pager: [phone]   

## 2015-04-05 NOTE — Discharge Summary (Signed)
[facility]  Physician Discharge Summary   Patient ID: Ruth Maldonado MRN: [id number] DOB/AGE: 62-Nov-1957 62 y.o.  Admit date: 04/03/2015 Discharge date: 04/05/2015  PCP: Nance Pear., NP  DISCHARGE DIAGNOSES:  Principal Problem:   Severe anemia Active Problems:   Bladder cancer (HCC)   HTN (hypertension)   Hypokalemia   Gross hematuria   Anemia   RECOMMENDATIONS FOR OUTPATIENT FOLLOW UP: 1. Patient has close follow-up appointment with her oncologist on Wednesday   DISCHARGE CONDITION: fair  Diet recommendation: As before  There were no vitals filed for this visit.  INITIAL HISTORY: 60 year old African-American female with a past medical history of bladder cancer, receiving chemotherapy at [facility] who underwent TUBRT at [facility] in October. She has metastatic lesions. She presented with significant hematuria ongoing for 3 days prior to admission. She was found to have hemoglobin of 3.8. She was hospitalized for further management.  Consultations:  None  Procedures:  Transfusion of 4 units of PRBCs.  HOSPITAL COURSE:   Severe microcytic anemia/acute blood loss Patient was transfused 4 units of PRBCs. Hemoglobin appears to have responded appropriately. Anemia is secondary to GU blood loss. Patient has a known history of bladder cancer and has been experiencing hematuria.   Gross hematuria in a patient with known history of metastatic bladder cancer Bleeding has subsided. He is quite likely that chemotherapy induced thrombocytopenia contributed. This resulted in severe anemia as discussed above. She gets care for her cancer at [facility]. She last saw a urologist in [place] about 2 years ago. She underwent transurethral resection of her bladder cancer in October at [facility]. Since bleeding has abated. She can follow-up with her providers at [facility]. She is interested in transferring care to [place] area. Asked her to  discuss this further with her oncologist at [facility].   History of essential hypertension. Blood pressure is reasonably well controlled. Continue with her home medications.  Hypokalemia Corrected.   Cancer related pain Continue with her long-acting OxyContin along with oxycodone for breakthrough pain.   Pain in the left thigh Her PET scan reports from [facility] were reviewed. She does have a known metastatic lesion in the left lesser trochanteric area. X-ray's were obtained here. No fractures noted. Same lesion was noted on these imaging studies as well. Patient was reassured. She will be seeing the physical therapist prior to discharge. She was asked to convey this information to her oncologist at [facility]. She may need to undergo radiation treatment to that area as well. In the meantime, she was asked to continue taking her pain medications.  Overall, stable. Bleeding has subsided. She will need to be followed by her oncologist at [facility]. She has an appointment this Wednesday. Blood work will need to be obtained at that time.  Once cleared by physical therapy, patient should be able to go home today.   PERTINENT LABS:  The results of significant diagnostics from this hospitalization (including imaging, microbiology, ancillary and laboratory) are listed below for reference.     Labs: Basic Metabolic Panel:  Recent Labs Lab 04/03/15 2040 04/04/15 0900 04/05/15 0410  NA 136 137 139  K 2.7* 3.5 4.0  CL 101 102 106  CO2 26 27 27   GLUCOSE 126* 140* 106*  BUN 10 9 8   CREATININE 0.91 0.90 0.80  CALCIUM 8.8* 8.5* 9.2  MG 1.5*  --   --   PHOS 3.2  --   --    Liver Function Tests:  Recent  Labs Lab 04/03/15 2040 04/04/15 0900  AST 20 24  ALT 15 16  ALKPHOS 86 82  BILITOT 0.4 1.2  PROT 6.0* 5.9*  ALBUMIN 3.2* 3.2*   CBC:  Recent Labs Lab 04/03/15 2040 04/04/15 0900 04/05/15 0410  WBC 4.1 5.4 6.4  NEUTROABS 2.4 3.1  --   HGB 3.8* 7.9* 10.8*  HCT 11.7* 23.8* 31.0*  MCV  74.5* 77.8* 79.5  PLT 42* 42* 57*    IMAGING STUDIES Dg Hip Unilat With Pelvis 2-3 Views Left  04/05/2015  CLINICAL DATA:  Generalized left hip pain EXAM: DG HIP (WITH OR WITHOUT PELVIS) 2-3V LEFT COMPARISON:  None. FINDINGS: AP pelvis along with frontal and frog-leg lateral views of the left hip show no evidence of fracture. Joint space in the hips is preserved. 2.3 cm lytic lesion identified in the lesser trochanter. IMPRESSION: Lytic lesion in the proximal femur. Given the history of metastatic bladder cancer, bony metastatic involvement is a primary consideration. These results will be called to the ordering clinician or representative by the Radiologist Assistant, and communication documented in the PACS or zVision Dashboard. Electronically Signed   By: Misty Stanley M.D.   On: 04/05/2015 11:05   Dg Femur Min 2 Views Left  04/05/2015  CLINICAL DATA:  Generalized left hip and femur pain. EXAM: LEFT FEMUR 2 VIEWS COMPARISON:  None. FINDINGS: Study is review in the context of the left hip exam performed at the same time. There is no evidence of femur fracture. Better demonstrated on the hip study, a 2.3 cm lytic lesion is identified in the lesser trochanter. IMPRESSION: 2.3 cm lytic lesion in the lesser trochanter, highly suggestive of metastatic disease in this patient with a history of bladder cancer. Electronically Signed   By: Misty Stanley M.D.   On: 04/05/2015 11:06    DISCHARGE EXAMINATION: Filed Vitals:   04/04/15 1556 04/04/15 1845 04/04/15 2111 04/05/15 0632  BP: 118/68 146/82 112/68 133/95  Pulse: 71 88 70 84  Temp: 98 F (36.7 C) 98.5 F (36.9 C) 98.3 F (36.8 C) 97.8 F (36.6 C)  TempSrc: Oral Oral Oral Oral  Resp: 16 18 14 16   SpO2: 100% 100% 100% 100%   General appearance: alert, cooperative, appears stated age and no distress Resp: clear to auscultation bilaterally Cardio: regular rate and rhythm, S1, S2 normal, no murmur, click, rub or gallop GI: soft, non-tender; bowel  sounds normal; no masses,  no organomegaly Extremities: No obvious deformity noted in the left thigh area. Limitation in active range of motion. Passively no tenderness was elicited. Full range of motion appreciated.  DISPOSITION: Home  Discharge Instructions    Call MD for:  difficulty breathing, headache or visual disturbances    Complete by:  As directed      Call MD for:  extreme fatigue    Complete by:  As directed      Call MD for:  persistant dizziness or light-headedness    Complete by:  As directed      Call MD for:  persistant nausea and vomiting    Complete by:  As directed      Call MD for:  severe uncontrolled pain    Complete by:  As directed      Call MD for:  temperature >100.4    Complete by:  As directed      Diet general    Complete by:  As directed      Discharge instructions    Complete by:  As directed   Please discuss with your cancer doctor regarding the pain in your left leg and if radiation is an option. Please talk to them also about transferring care to this area if you still want it to be so. Stay well hydrated. Seek attention if bleeding gets worse.  You were cared for by a hospitalist during your hospital stay. If you have any questions about your discharge medications or the care you received while you were in the hospital after you are discharged, you can call the unit and asked to speak with the hospitalist on call if the hospitalist that took care of you is not available. Once you are discharged, your primary care physician will handle any further medical issues. Please note that NO REFILLS for any discharge medications will be authorized once you are discharged, as it is imperative that you return to your primary care physician (or establish a relationship with a primary care physician if you do not have one) for your aftercare needs so that they can reassess your need for medications and monitor your lab values. If you do not have a primary care physician,  you can call [phone] for a physician referral.     Increase activity slowly    Complete by:  As directed            ALLERGIES:  Allergies  Allergen Reactions  . Adhesive [Tape] Itching     Current Discharge Medication List    CONTINUE these medications which have CHANGED   Details  !! oxyCODONE (OXY IR/ROXICODONE) 5 MG immediate release tablet Take 1-2 tablets (5-10 mg total) by mouth every 4 (four) hours as needed for severe pain. Qty: 100 tablet, Refills: 0     !! - Potential duplicate medications found. Please discuss with provider.    CONTINUE these medications which have NOT CHANGED   Details  Cholecalciferol (VITAMIN D3) 1000 UNITS CAPS Take 1 capsule by mouth daily.    Docusate Sodium (STOOL SOFTENER) 100 MG capsule Take 100 mg by mouth 2 (two) times daily.    hydrochlorothiazide (HYDRODIURIL) 25 MG tablet TAKE ONE TABLET BY MOUTH ONCE DAILY Qty: 30 tablet, Refills: 5    ibuprofen (ADVIL,MOTRIN) 200 MG tablet Take 200 mg by mouth every 6 (six) hours as needed for mild pain or moderate pain.    LORazepam (ATIVAN) 0.5 MG tablet Take 0.5 mg by mouth every 6 (six) hours as needed for anxiety.    losartan (COZAAR) 50 MG tablet TAKE ONE TABLET BY MOUTH ONCE DAILY Qty: 30 tablet, Refills: 5    milk thistle 175 MG tablet Take 175 mg by mouth daily as needed.     !! OVER THE COUNTER MEDICATION TUMERIC.  1 dropper per day.    !! OVER THE COUNTER MEDICATION Apply topically. LEMON GRASS ESSENTIAL OILS.  Once a day.    !! OVER THE COUNTER MEDICATION FRANKINCENSE.  2 drops under tongue three times daily.    !! OVER THE COUNTER MEDICATION JUNIPER BERRY AND GRAPEFRUIT.  Mix together take in 1 veggie capsule daily.    !! OVER THE COUNTER MEDICATION GERANIUM AND CLARYSAGE.  Place in capsule and take once to twice a day.    !! OVER THE COUNTER MEDICATION LAVENDER AND YLANG YLANG. Apply to bottom of feet once a day.    oxyCODONE (OXYCONTIN) 20 mg 12 hr tablet Take 20 mg by  mouth every 8 (eight) hours.    !! Oxycodone HCl 10 MG TABS Take 10 mg  by mouth every 4 (four) hours as needed (For up to 7 days.).    polyethylene glycol (MIRALAX / GLYCOLAX) packet Take 17 g by mouth daily.    PRESCRIPTION MEDICATION Chemo at [facility].  Oncologist Tessie Eke.    sennosides-docusate sodium (SENOKOT-S) 8.6-50 MG tablet Take 1 tablet by mouth 2 (two) times daily.    vitamin E 400 UNIT capsule Take 400 Units by mouth daily.     !! - Potential duplicate medications found. Please discuss with provider.     Follow-up Information    Schedule an appointment as soon as possible for a visit with Nance Pear., NP.   Specialty:  Internal Medicine   Contact information:   [place] [address] [phone]       TOTAL DISCHARGE TIME: 13 mins  [facility] Pager [phone]  04/05/2015, 3:06 PM

## 2015-04-07 LAB — TYPE AND SCREEN
ABO/RH(D): A POS
ANTIBODY SCREEN: NEGATIVE
UNIT DIVISION: 0
Unit division: 0
Unit division: 0
Unit division: 0
Unit division: 0
Unit division: 0

## 2015-04-09 ENCOUNTER — Ambulatory Visit: Admission: RE | Admit: 2015-04-09 | Payer: Medicaid Other | Source: Ambulatory Visit | Admitting: Radiation Oncology

## 2015-04-14 ENCOUNTER — Encounter: Payer: Self-pay | Admitting: Radiation Oncology

## 2015-04-14 NOTE — Progress Notes (Addendum)
Histology and Location of Primary Cancer: metastatic  Bladder cancer with metastatic bone proximal femur(Left)  Sites of Visceral and Bony Metastatic Disease: Left hip   Location(s) of Symptomatic Metastases: 2.3 cm lytic lesion in the lesser trochanter, highly suggestive of metastatic disease in this patient with a history of bladder cancer.    Past/Anticipated chemotherapy by medical oncology, if any: on Palliative chemotherapy at [facility] with Dr. Kalman Shan  Pain on a scale of 0-10 is: controlled with /Oxycontin 87m every 8 hours and for breakthrough pain Oxycontin 121mevery 4 hours prn, left groin area,  3/10  Now just had taken pain med before coming,    If Spine Met(s), symptoms, if any, include:  Bowel/Bladder retention or incontinence Chronic  Hematuria , blood clots  Past 3 weeks, blood transfusion given, ,dysuria,frequency , constipation,   Takes miralax and senokot prn,   Numbness or weakness in extremities (please describe): No  Current Decadron regimen, if applicable: NO  Ambulatory status? Walker? Wheelchair?: ambulatory    SAFETY ISSUES:  Prior radiation? NO  Pacemaker/ICD? NO  Possible current pregnancy? NO  Is the patient on methotrexate? NO  Current Complaints / other details:  Married, 1 son,  former smoker 1ppd x 24 years quit 06/11/1998, no alcohol,drug or smokeless tobacco use, Hx cocaine/heoine,quit 18 years ago;   Mother Epilepsy, emphysema, COPD,  lung cancer, HTN,  Deceased, Father MI, deceased  Allergies:Adhesive tape-itching ,blisters/erythema BP 135/74 mmHg  Pulse 102  Temp(Src) 98.4 F (36.9 C) (Oral)  Resp 20  Ht 5' 5.5" (1.664 m)  Wt 160 lb 8 oz (72.802 kg)  BMI 26.29 kg/m2  Wt Readings from Last 3 Encounters:  04/15/15 160 lb 8 oz (72.802 kg)  12/14/14 164 lb 12.8 oz (74.753 kg)  11/02/14 165 lb (74.844 kg)

## 2015-04-15 ENCOUNTER — Ambulatory Visit
Admission: RE | Admit: 2015-04-15 | Discharge: 2015-04-15 | Disposition: A | Discharge status: Home / Self Care | Payer: Medicaid Other | Source: Ambulatory Visit | Attending: Radiation Oncology | Admitting: Radiation Oncology

## 2015-04-15 ENCOUNTER — Encounter: Payer: Self-pay | Admitting: Radiation Oncology

## 2015-04-15 VITALS — BP 135/74 | HR 102 | Temp 98.4°F | Resp 20 | Ht 65.5 in | Wt 160.5 lb

## 2015-04-15 DIAGNOSIS — Z923 Personal history of irradiation: Secondary | ICD-10-CM | POA: Insufficient documentation

## 2015-04-15 DIAGNOSIS — C7951 Secondary malignant neoplasm of bone: Secondary | ICD-10-CM

## 2015-04-15 DIAGNOSIS — Z809 Family history of malignant neoplasm, unspecified: Secondary | ICD-10-CM | POA: Diagnosis not present

## 2015-04-15 DIAGNOSIS — Z87891 Personal history of nicotine dependence: Secondary | ICD-10-CM | POA: Diagnosis not present

## 2015-04-15 DIAGNOSIS — Z510 Encounter for antineoplastic radiation therapy: Secondary | ICD-10-CM | POA: Diagnosis not present

## 2015-04-15 DIAGNOSIS — C801 Malignant (primary) neoplasm, unspecified: Secondary | ICD-10-CM | POA: Diagnosis not present

## 2015-04-15 DIAGNOSIS — I10 Essential (primary) hypertension: Secondary | ICD-10-CM | POA: Diagnosis not present

## 2015-04-15 DIAGNOSIS — C7911 Secondary malignant neoplasm of bladder: Secondary | ICD-10-CM | POA: Insufficient documentation

## 2015-04-15 DIAGNOSIS — Z8249 Family history of ischemic heart disease and other diseases of the circulatory system: Secondary | ICD-10-CM | POA: Insufficient documentation

## 2015-04-15 HISTORY — DX: Encounter for other specified aftercare: Z51.89

## 2015-04-15 NOTE — Progress Notes (Signed)
Please see the Nurse Progress Note in the MD Initial Consult Encounter for this patient. 

## 2015-04-16 ENCOUNTER — Encounter: Payer: Self-pay | Admitting: Radiation Oncology

## 2015-04-16 DIAGNOSIS — C7951 Secondary malignant neoplasm of bone: Secondary | ICD-10-CM | POA: Insufficient documentation

## 2015-04-16 NOTE — Progress Notes (Signed)
Radiation Oncology         [phone] ________________________________  Name: Ruth Maldonado MRN: [id number]  Date: 04/15/2015  DOB: [birth year]  CC:O'SULLIVAN,[name] S., NP  Debbrah Alar, NP     REFERRING PHYSICIAN: Debbrah Alar, NP   DIAGNOSIS: The primary encounter diagnosis was Metastatic bone tumor ([place]). A diagnosis of Bone metastasis ([place]) was also pertinent to this visit.   HISTORY OF PRESENT ILLNESS::Ruth Maldonado is a 60 y.o. female who is seen for an initial consultation visit regarding the patient's diagnosis of metastatic bladder cancer  Ruth Maldonado is a pleasant 60 y.o. patient seen at the request of Dr.  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 Waynette Buttery also with a diagnosis of extramammary paget's disease of the vagina. She was lost to follow up by GYN but proceeded with her care for her bladder cancer in [place], then was cared for at [facility] under the care of Dr.  Shanon Brow and Dr. Sherral Hammers. She presented in the spring of 2016 again after being lost to follow up for several months, and was diagnosed with progressive disease and underwent 3 cycles of etoposide/carboplatin.  She most recently underwent a transurethral resection of bladder tumor with cystourethroscopy on 01/11/15 due to symptomatology. 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 She was again lost to follow up, but presented locally to the ED on 04/03/15 due to hematuria with a HGB of 3, and hip pain. While hospitalized, an x-ray of the left femur revealing a 2.3 cm lytic lesion at the lesser trochanter.  X ray of the hip and pelvis confirmed a lytic lesion of the proximal femur as well.   The patient recently has been having some increased hematuria and she has discussed possible palliative radiation treatment to the bladder with her  physicians in oncology at [facility] in addition to palliative treatment to the left femur. Next   Histology and Location of Primary Cancer: metastatic  Bladder cancer with metastatic bone proximal femur(Left)  Sites of Visceral and Bony Metastatic Disease: Left hip   Location(s) of Symptomatic Metastases: 2.3 cm lytic lesion in the lesser trochanter, highly suggestive of metastatic disease in this patient with a history of bladder cancer.    Past/Anticipated chemotherapy by medical oncology, if any: on Palliative chemotherapy at [facility] with Dr. Kalman Shan  Pain on a scale of 0-10 is: controlled with /Oxycontin '20mg'$  every 8 hours and for breakthrough pain Oxycontin '10mg'$  every 4 hours prn, left groin area,  3/10  Now just had taken pain med before coming,    If Spine Met(s), symptoms, if any, include:  Bowel/Bladder retention or incontinence Chronic  Hematuria , blood clots  Past 3 weeks, blood transfusion given, ,dysuria,frequency , constipation,   Takes miralax and senokot prn,   Numbness or weakness in extremities (please describe): No  Current Decadron regimen, if applicable: NO  Ambulatory status? Walker? Wheelchair?: ambulatory    SAFETY ISSUES:  Prior radiation? NO  Pacemaker/ICD? NO  Possible current pregnancy? NO  Is the patient on methotrexate? NO  Current Complaints / other details:  Married, 1 son,  former smoker 1ppd x 24 years quit 06/11/1998, no alcohol,drug or smokeless tobacco use, Hx cocaine/heoine,quit 18 years ago;   Mother Epilepsy, emphysema, COPD,  lung cancer, HTN,  Deceased, Father MI, deceased  Allergies:Adhesive tape-itching ,blisters/erythema BP 135/74 mmHg  Pulse 102  Temp(Src) 98.4  F (36.9 C) (Oral)  Resp 20  Ht 5' 5.5" (1.664 m)  Wt 160 lb 8 oz (72.802 kg)  BMI 26.29 kg/m2  Wt Readings from Last 3 Encounters:  04/15/15 160 lb 8 oz (72.802 kg)  12/14/14 164 lb 12.8 oz (74.753 kg)  11/02/14 165 lb (74.844 kg)      PREVIOUS RADIATION  THERAPY: Yes:the patient received palliative radiation treatment at [facility] to the left shoulder a couple of months ago     [place]:  has a past medical history of Hypertension; History of uterine fibroid; Bladder tumor; Gross hematuria; Bladder spasm; Wears contact lenses; and Blood transfusion without reported diagnosis.     PAST SURGICAL HISTORY: Past Surgical History  Procedure Laterality Date  . Abdominal hysterectomy  2000  . Colposcopy    . Transurethral resection of bladder tumor with mitomycin-c N/A 06/13/2013    Procedure: CYSTOSCOPY TRANSURETHERAL RESECTION BLADDER TUMOR BILATERAL  RETROGRADE PYLEOGRAM INSULATION OF MITOMYCON C;  Surgeon: Ardis Hughs, MD;  Location: [facility];  Service: Urology;  Laterality: N/A;  . Cystoscopy w/ retrogrades N/A 06/13/2013    Procedure: CYSTOSCOPY WITH RETROGRADE PYELOGRAM;  Surgeon: Ardis Hughs, MD;  Location: [facility];  Service: Urology;  Laterality: N/A;     FAMILY HISTORY: family history includes Cancer in her mother; Emphysema in her mother; Epilepsy in her mother; Heart attack in her father; Hypertension in her mother.   SOCIAL HISTORY:  reports that she quit smoking about 16 years ago. Her smoking use included Cigarettes. She has a 24 Maldonado-year smoking history. She has never used smokeless tobacco. She reports that she does not drink alcohol or use illicit drugs.   ALLERGIES: Adhesive   MEDICATIONS:  Current Outpatient Prescriptions  Medication Sig Dispense Refill  . Cholecalciferol (VITAMIN D3) 1000 UNITS CAPS Take 1 capsule by mouth daily.    [name] Sodium (STOOL SOFTENER) 100 MG capsule Take 100 mg by mouth 2 (two) times daily.    . hydrochlorothiazide (HYDRODIURIL) 25 MG tablet TAKE ONE TABLET BY MOUTH ONCE DAILY 30 tablet 5  . ibuprofen (ADVIL,MOTRIN) 200 MG tablet Take 200 mg by mouth every 6 (six) hours as needed for mild pain or moderate pain.    [name] Kitchen  LORazepam (ATIVAN) 0.5 MG tablet Take 0.5 mg by mouth every 6 (six) hours as needed for anxiety.    [name] Kitchen losartan (COZAAR) 50 MG tablet TAKE ONE TABLET BY MOUTH ONCE DAILY 30 tablet 5  . milk thistle 175 MG tablet Take 175 mg by mouth daily as needed.     [name] Kitchen OVER THE COUNTER MEDICATION TUMERIC.  1 dropper per day.    [name] Kitchen OVER THE COUNTER MEDICATION Apply topically. LEMON GRASS ESSENTIAL OILS.  Once a day.    [name] Kitchen OVER THE COUNTER MEDICATION FRANKINCENSE.  2 drops under tongue three times daily.    [name] Kitchen OVER THE COUNTER MEDICATION JUNIPER BERRY AND GRAPEFRUIT.  Mix together take in 1 veggie capsule daily.    [name] Kitchen OVER THE COUNTER MEDICATION LAVENDER AND YLANG YLANG. Apply to bottom of feet once a day.    . oxyCODONE (OXYCONTIN) 20 mg 12 hr tablet Take 20 mg by mouth every 8 (eight) hours.    . Oxycodone HCl 10 MG TABS Take 10 mg by mouth every 4 (four) hours as needed (For up to 7 days.).    [name] Kitchen polyethylene glycol (MIRALAX / GLYCOLAX) packet Take 17 g by mouth daily as needed.     [name] Kitchen  sennosides-docusate sodium (SENOKOT-S) 8.6-50 MG tablet Take 1 tablet by mouth 2 (two) times daily.    . vitamin E 400 UNIT capsule Take 400 Units by mouth daily.    [name] Kitchen PRESCRIPTION MEDICATION Chemo at [facility].  Oncologist Tessie Eke.     No current facility-administered medications for this encounter.     REVIEW OF SYSTEMS:  A 15 point review of systems is documented in the electronic medical record. This was obtained by the nursing staff. However, I reviewed this with the patient to discuss relevant findings and make appropriate changes.  General: Well-developed, in no acute distress HEENT: Normocephalic, atraumatic; oral cavity clear Neck: Supple without any lymphadenopathy Cardiovascular: Regular rate and rhythm Respiratory: Clear to auscultation bilaterally GI: Soft, nontender, normal bowel sounds Extremities: No edema present;  hyperpigmentation and desquamation present in the left axilla in the site of radiation treatment  palliatively Neuro: No focal deficits      PHYSICAL EXAM:  height is 5' 5.5" (1.664 m) and weight is 160 lb 8 oz (72.802 kg). Her oral temperature is 98.4 F (36.9 C). Her blood pressure is 135/74 and her pulse is 102. Her respiration is 20.   ECOG = 1  0 - Asymptomatic (Fully active, able to carry on all predisease activities without restriction)  1 - Symptomatic but completely ambulatory (Restricted in physically strenuous activity but ambulatory and able to carry out work of a light or sedentary nature. For example, light housework, office work)  2 - Symptomatic, <50% in bed during the day (Ambulatory and capable of all self care but unable to carry out any work activities. Up and about more than 50% of waking hours)  3 - Symptomatic, >50% in bed, but not bedbound (Capable of only limited self-care, confined to bed or chair 50% or more of waking hours)  4 - Bedbound (Completely disabled. Cannot carry on any self-care. Totally confined to bed or chair)  5 - Death   Eustace Pen MM, Creech RH, Tormey DC, et al. [phone]). "Toxicity and response criteria of the [facility]". [place] Oncol. 5 (6): 649-55     LABORATORY DATA:  Lab Results  Component Value Date   WBC 6.4 04/05/2015   HGB 10.8* 04/05/2015   HCT 31.0* 04/05/2015   MCV 79.5 04/05/2015   PLT 57* 04/05/2015   Lab Results  Component Value Date   NA 139 04/05/2015   K 4.0 04/05/2015   CL 106 04/05/2015   CO2 27 04/05/2015   Lab Results  Component Value Date   ALT 16 04/04/2015   AST 24 04/04/2015   ALKPHOS 82 04/04/2015   BILITOT 1.2 04/04/2015      RADIOGRAPHY: Dg Hip Unilat With Pelvis 2-3 Views Left  04/05/2015  CLINICAL DATA:  Generalized left hip pain EXAM: DG HIP (WITH OR WITHOUT PELVIS) 2-3V LEFT COMPARISON:  None. FINDINGS: AP pelvis along with frontal and frog-leg lateral views of the left hip show no evidence of fracture. Joint space in the hips is preserved. 2.3 cm lytic lesion  identified in the lesser trochanter. IMPRESSION: Lytic lesion in the proximal femur. Given the history of metastatic bladder cancer, bony metastatic involvement is a primary consideration. These results will be called to the ordering clinician or representative by the Radiologist Assistant, and communication documented in the PACS or zVision Dashboard. Electronically Signed   By: Misty Stanley M.D.   On: 04/05/2015 11:05   Dg Femur Min 2 Views Left  04/05/2015  CLINICAL DATA:  Generalized left hip and femur pain. EXAM: LEFT FEMUR 2 VIEWS COMPARISON:  None. FINDINGS: Study is review in the context of the left hip exam performed at the same time. There is no evidence of femur fracture. Better demonstrated on the hip study, a 2.3 cm lytic lesion is identified in the lesser trochanter. IMPRESSION: 2.3 cm lytic lesion in the lesser trochanter, highly suggestive of metastatic disease in this patient with a history of bladder cancer. Electronically Signed   By: Misty Stanley M.D.   On: 04/05/2015 11:06       IMPRESSION:  The patient has metastatic bladder cancer and has been receiving ongoing [facility]. The patient it appears has had some difficulty dealing with her diagnosis. This has led to her being lost to follow-up at various times since her original diagnosis. She indicated a potential preference to have more of her care here in [place] but she can further discuss this with medical oncology at [facility] where she is very well plugged in. She is scheduled to see them tomorrow.   the patient looks like a very good candidate for palliative radiation treatment to the proximal left femur to a lytic metastasis in this region as well as palliative radiation treatment to the bladder in light of ongoing hematuria. I have reviewed her PET scan and do not see any other lesions which would require targeting at this time. She does have a lytic lesion in the right anterior pelvis which  also could be radiated if the patient developed significant pain at this location. It is not in a weightbearing area.   PLAN: The patient will be scheduled for simulation as soon as possible and we will proceed with radiation treatment. The patient is scheduled to begin additional systemic treatment in [place] in the near future. I would therefore favor an approximate 3 week course of treatment with concurrent systemic treatment. This was discussed and the patient and she does wish to proceed with this treatment.      ________________________________   Jodelle Gross, MD, PhD   **Disclaimer: This note was dictated with voice recognition software. Similar sounding words can inadvertently be transcribed and this note may contain transcription errors which may not have been corrected upon publication of note.**

## 2015-04-19 ENCOUNTER — Ambulatory Visit: Admission: RE | Admit: 2015-04-19 | Payer: Medicaid Other | Source: Ambulatory Visit | Admitting: Radiation Oncology

## 2015-04-21 ENCOUNTER — Ambulatory Visit
Admission: RE | Admit: 2015-04-21 | Discharge: 2015-04-21 | Disposition: A | Discharge status: Home / Self Care | Payer: Medicaid Other | Source: Ambulatory Visit | Attending: Radiation Oncology | Admitting: Radiation Oncology

## 2015-04-21 DIAGNOSIS — C7951 Secondary malignant neoplasm of bone: Secondary | ICD-10-CM

## 2015-04-21 DIAGNOSIS — Z510 Encounter for antineoplastic radiation therapy: Secondary | ICD-10-CM | POA: Diagnosis not present

## 2015-04-22 NOTE — Progress Notes (Signed)
  Radiation Oncology         [phone] ________________________________  Name: Kily Hopf MRN: [id number]  Date: 04/21/2015  DOB: Dec 19, 1955  SIMULATION AND TREATMENT PLANNING NOTE  DIAGNOSIS:     ICD-9-CM ICD-10-CM   1. Bone metastasis (HCC) 198.5 C79.51      Site:   1. Left femur 2.  Bladder  NARRATIVE:  The patient was brought to the [place].  Identity was confirmed.  All relevant records and images related to the planned course of therapy were reviewed.   Written consent to proceed with treatment was confirmed which was freely given after reviewing the details related to the planned course of therapy had been reviewed with the patient.  Then, the patient was set-up in a stable reproducible  supine position for radiation therapy.  CT images were obtained.  Surface markings were placed.    Medically necessary complex treatment device(s) for immobilization:  Customized vac lock bag.   The CT images were loaded into the planning software.  Then the target and avoidance structures were contoured.  Treatment planning then occurred.  The radiation prescription was entered and confirmed.  A total of 4 complex treatment devices were fabricated which relate to the designed radiation treatment fields. Each of these customized fields/ complex treatment devices will be used on a daily basis during the radiation course. I have requested : 3D Simulation  I have requested a DVH of the following structures: Target volume, rectum, left femoral head, right femoral head.   The patient will undergo daily image guidance to ensure accurate localization of the target, and adequate minimize dose to the normal surrounding structures in close proximity to the target.  PLAN:  The patient will receive 37.5 Gy in 15 fractions.    Special treatment procedure The patient will also receive concurrent chemotherapy during the treatment. The patient may therefore experience increased  toxicity or side effects and the patient will be monitored for such problems. This may require extra lab work as necessary. This therefore constitutes a special treatment procedure.  ________________________________   Jodelle Gross, MD, PhD

## 2015-04-23 DIAGNOSIS — Z510 Encounter for antineoplastic radiation therapy: Secondary | ICD-10-CM | POA: Diagnosis not present

## 2015-04-28 ENCOUNTER — Ambulatory Visit: Payer: Medicaid Other | Admitting: Radiation Oncology

## 2015-04-29 ENCOUNTER — Ambulatory Visit
Admission: RE | Admit: 2015-04-29 | Discharge: 2015-04-29 | Disposition: A | Discharge status: Home / Self Care | Payer: Medicaid Other | Source: Ambulatory Visit | Attending: Radiation Oncology | Admitting: Radiation Oncology

## 2015-04-29 DIAGNOSIS — Z510 Encounter for antineoplastic radiation therapy: Secondary | ICD-10-CM | POA: Diagnosis not present

## 2015-04-30 ENCOUNTER — Ambulatory Visit
Admission: RE | Admit: 2015-04-30 | Discharge: 2015-04-30 | Disposition: A | Discharge status: Home / Self Care | Payer: Medicaid Other | Source: Ambulatory Visit | Attending: Radiation Oncology | Admitting: Radiation Oncology

## 2015-04-30 DIAGNOSIS — C679 Malignant neoplasm of bladder, unspecified: Secondary | ICD-10-CM | POA: Diagnosis not present

## 2015-04-30 DIAGNOSIS — Z510 Encounter for antineoplastic radiation therapy: Secondary | ICD-10-CM | POA: Diagnosis not present

## 2015-04-30 DIAGNOSIS — C7951 Secondary malignant neoplasm of bone: Secondary | ICD-10-CM

## 2015-04-30 MED ORDER — RADIAPLEXRX EX GEL
Freq: Once | CUTANEOUS | Status: AC
  Administered 2015-04-30: 13:00:00 via TOPICAL

## 2015-04-30 MED ORDER — OXYCODONE HCL 10 MG PO TABS: 10.0000 mg | ORAL_TABLET | ORAL | Status: AC | PRN

## 2015-04-30 NOTE — Progress Notes (Signed)
Weekly rda tx bladder/left femur 2/15 completed, pain level 2/10 and bladder  Hip area  , low back, takes oxycodone 10mg  every 4 hours for breakthrough pain ,asking for a weeks worth refill of this till she can see her Primary MD, pt education done, radiation therapy and you book, my busines card given to pateint,discussed ways to manage side effects, teach back given radiaplex given to use daily after radiation 12:47 PM There were no vitals taken for this visit.  Wt Readings from Last 3 Encounters:  04/15/15 160 lb 8 oz (72.802 kg)  12/14/14 164 lb 12.8 oz (74.753 kg)  11/02/14 165 lb (74.844 kg)

## 2015-05-02 NOTE — Progress Notes (Signed)
Department of Radiation Oncology  Phone:  [phone] Fax:        [phone]  Weekly Treatment Note    Name: Ruth Maldonado Date: 05/02/2015 MRN: [id number] DOB: 1955-07-03   Diagnosis:     ICD-9-CM ICD-10-CM   1. Bone metastasis (HCC) 198.5 C79.51 hyaluronate sodium (RADIAPLEXRX) gel  2. Malignant neoplasm of urinary bladder, unspecified site (HCC) 188.9 C67.9      Current dose: 5 Gy  Current fraction: 2   MEDICATIONS: Current Outpatient Prescriptions  Medication Sig Dispense Refill  . Cholecalciferol (VITAMIN D3) 1000 UNITS CAPS Take 1 capsule by mouth daily.    Ruth Maldonado Sodium (STOOL SOFTENER) 100 MG capsule Take 100 mg by mouth 2 (two) times daily.    . hyaluronate sodium (RADIAPLEXRX) GEL Apply 1 application topically daily.    . hydrochlorothiazide (HYDRODIURIL) 25 MG tablet TAKE ONE TABLET BY MOUTH ONCE DAILY 30 tablet 5  . ibuprofen (ADVIL,MOTRIN) 200 MG tablet Take 200 mg by mouth every 6 (six) hours as needed for mild pain or moderate pain.    Ruth Maldonado Kitchen LORazepam (ATIVAN) 0.5 MG tablet Take 0.5 mg by mouth every 6 (six) hours as needed for anxiety.    Ruth Maldonado Kitchen losartan (COZAAR) 50 MG tablet TAKE ONE TABLET BY MOUTH ONCE DAILY 30 tablet 5  . OVER THE COUNTER MEDICATION TUMERIC.  1 dropper per day.    Ruth Maldonado Kitchen OVER THE COUNTER MEDICATION Apply topically. LEMON GRASS ESSENTIAL OILS.  Once a day.    Ruth Maldonado Kitchen OVER THE COUNTER MEDICATION FRANKINCENSE.  2 drops under tongue three times daily.    Ruth Maldonado Kitchen OVER THE COUNTER MEDICATION JUNIPER BERRY AND GRAPEFRUIT.  Mix together take in 1 veggie capsule daily.    Ruth Maldonado Kitchen OVER THE COUNTER MEDICATION LAVENDER AND YLANG YLANG. Apply to bottom of feet once a day.    . oxyCODONE (OXYCONTIN) 20 mg 12 hr tablet Take 20 mg by mouth every 8 (eight) hours.    . Oxycodone HCl 10 MG TABS Take 1 tablet (10 mg total) by mouth every 4 (four) hours as needed. 40 tablet 0  . polyethylene glycol (MIRALAX / GLYCOLAX) packet Take 17 g by mouth daily as needed. Reported on  04/30/2015    . PRESCRIPTION MEDICATION Chemo at [facility].  Oncologist Tessie Eke.    . sennosides-docusate sodium (SENOKOT-S) 8.6-50 MG tablet Take 1 tablet by mouth 2 (two) times daily.    . vitamin E 400 UNIT capsule Take 400 Units by mouth daily.    . milk thistle 175 MG tablet Take 175 mg by mouth daily as needed. Reported on 04/30/2015     No current facility-administered medications for this encounter.     ALLERGIES: Adhesive   LABORATORY DATA:  Lab Results  Component Value Date   WBC 6.4 04/05/2015   HGB 10.8* 04/05/2015   HCT 31.0* 04/05/2015   MCV 79.5 04/05/2015   PLT 57* 04/05/2015   Lab Results  Component Value Date   NA 139 04/05/2015   K 4.0 04/05/2015   CL 106 04/05/2015   CO2 27 04/05/2015   Lab Results  Component Value Date   ALT 16 04/04/2015   AST 24 04/04/2015   ALKPHOS 82 04/04/2015   BILITOT 1.2 04/04/2015     NARRATIVE: Ruth Maldonado was seen today for weekly treatment management. The chart was checked and the patient's films were reviewed.  Weekly rda tx bladder/left femur 2/15 completed, pain level 2/10 and bladder  Hip area  , low back, takes oxycodone 10mg  every  4 hours for breakthrough pain ,asking for a weeks worth refill of this till she can see her Primary MD, pt education done, radiation therapy and you book, my busines card given to pateint,discussed ways to manage side effects, teach back given radiaplex given to use daily after radiation 11:41 AM There were no vitals taken for this visit.  Wt Readings from Last 3 Encounters:  04/15/15 160 lb 8 oz (72.802 kg)  12/14/14 164 lb 12.8 oz (74.753 kg)  11/02/14 165 lb (74.844 kg)    PHYSICAL EXAMINATION:   patient is doing very well today. Alert, in no acute distress  ASSESSMENT: The patient is doing satisfactorily with treatment.  PLAN: We will continue with the patient's radiation treatment as planned. The patient was given a refill for her pain medication which should last her for 1  week until she can reconnect with her primary care physician who has been managing this.

## 2015-05-03 ENCOUNTER — Ambulatory Visit
Admission: RE | Admit: 2015-05-03 | Discharge: 2015-05-03 | Disposition: A | Discharge status: Home / Self Care | Payer: Medicaid Other | Source: Ambulatory Visit | Attending: Radiation Oncology | Admitting: Radiation Oncology

## 2015-05-03 DIAGNOSIS — Z510 Encounter for antineoplastic radiation therapy: Secondary | ICD-10-CM | POA: Diagnosis not present

## 2015-05-04 ENCOUNTER — Ambulatory Visit
Admission: RE | Admit: 2015-05-04 | Discharge: 2015-05-04 | Disposition: A | Discharge status: Home / Self Care | Payer: Medicaid Other | Source: Ambulatory Visit | Attending: Radiation Oncology | Admitting: Radiation Oncology

## 2015-05-04 DIAGNOSIS — Z510 Encounter for antineoplastic radiation therapy: Secondary | ICD-10-CM | POA: Diagnosis not present

## 2015-05-05 ENCOUNTER — Ambulatory Visit
Admission: RE | Admit: 2015-05-05 | Discharge: 2015-05-05 | Disposition: A | Discharge status: Home / Self Care | Payer: Medicaid Other | Source: Ambulatory Visit | Attending: Radiation Oncology | Admitting: Radiation Oncology

## 2015-05-05 DIAGNOSIS — Z510 Encounter for antineoplastic radiation therapy: Secondary | ICD-10-CM | POA: Diagnosis not present

## 2015-05-05 NOTE — Telephone Encounter (Signed)
Health maintenance updated

## 2015-05-05 NOTE — Telephone Encounter (Signed)
Decline - undergoing cancer trx

## 2015-05-06 ENCOUNTER — Ambulatory Visit
Admission: RE | Admit: 2015-05-06 | Discharge: 2015-05-06 | Disposition: A | Discharge status: Home / Self Care | Payer: Medicaid Other | Source: Ambulatory Visit | Attending: Radiation Oncology | Admitting: Radiation Oncology

## 2015-05-06 DIAGNOSIS — Z510 Encounter for antineoplastic radiation therapy: Secondary | ICD-10-CM | POA: Diagnosis not present

## 2015-05-07 ENCOUNTER — Ambulatory Visit
Admission: RE | Admit: 2015-05-07 | Discharge: 2015-05-07 | Disposition: A | Discharge status: Home / Self Care | Payer: Medicaid Other | Source: Ambulatory Visit | Attending: Radiation Oncology | Admitting: Radiation Oncology

## 2015-05-07 ENCOUNTER — Encounter: Payer: Self-pay | Admitting: Radiation Oncology

## 2015-05-07 VITALS — BP 115/68 | HR 89 | Temp 97.8°F | Ht 65.5 in | Wt 154.0 lb

## 2015-05-07 DIAGNOSIS — C7951 Secondary malignant neoplasm of bone: Secondary | ICD-10-CM

## 2015-05-07 DIAGNOSIS — Z510 Encounter for antineoplastic radiation therapy: Secondary | ICD-10-CM | POA: Diagnosis not present

## 2015-05-07 DIAGNOSIS — C673 Malignant neoplasm of anterior wall of bladder: Secondary | ICD-10-CM

## 2015-05-07 NOTE — Progress Notes (Signed)
Mrs. Has received 7 fractions to bladder/left femur.  Denies pain today, takes Oxycodone for pain control pain can be 6/10. [name] Kitchen  Appetite is altered at times drinking boost.  Energy level is good takes a nap most days.  Skin to left femur without normal color using Radiaplex.  No bladder or bowel pattern changes. BP 115/68 mmHg  Pulse 89  Temp(Src) 97.8 F (36.6 C) (Oral)  Ht 5' 5.5" (1.664 m)  Wt 154 lb (69.854 kg)  BMI 25.23 kg/m2  SpO2 100%  Wt Readings from Last 3 Encounters:  05/07/15 154 lb (69.854 kg)  04/15/15 160 lb 8 oz (72.802 kg)  12/14/14 164 lb 12.8 oz (74.753 kg)

## 2015-05-07 NOTE — Progress Notes (Signed)
Department of Radiation Oncology  Phone:  [phone] Fax:        [phone]  Weekly Treatment Note    Name: Ruth Maldonado Date: 05/07/2015 MRN: [id number] DOB: Nov 13, 1955   Diagnosis:     ICD-9-CM ICD-10-CM   1. Malignant neoplasm of anterior wall of urinary bladder (HCC) 188.3 C67.3      Current dose: 17.5 Gy  Current fraction:7/15   MEDICATIONS: Current Outpatient Prescriptions  Medication Sig Dispense Refill  . Cholecalciferol (VITAMIN D3) 1000 UNITS CAPS Take 1 capsule by mouth daily.    [name] Sodium (STOOL SOFTENER) 100 MG capsule Take 100 mg by mouth 2 (two) times daily.    . hyaluronate sodium (RADIAPLEXRX) GEL Apply 1 application topically daily.    . hydrochlorothiazide (HYDRODIURIL) 25 MG tablet TAKE ONE TABLET BY MOUTH ONCE DAILY 30 tablet 5  . ibuprofen (ADVIL,MOTRIN) 200 MG tablet Take 200 mg by mouth every 6 (six) hours as needed for mild pain or moderate pain.    [name] Kitchen LORazepam (ATIVAN) 0.5 MG tablet Take 0.5 mg by mouth every 6 (six) hours as needed for anxiety.    [name] Kitchen losartan (COZAAR) 50 MG tablet TAKE ONE TABLET BY MOUTH ONCE DAILY 30 tablet 5  . milk thistle 175 MG tablet Take 175 mg by mouth daily as needed. Reported on 04/30/2015    . OVER THE COUNTER MEDICATION TUMERIC.  1 dropper per day.    [name] Kitchen OVER THE COUNTER MEDICATION Apply topically. LEMON GRASS ESSENTIAL OILS.  Once a day.    [name] Kitchen OVER THE COUNTER MEDICATION FRANKINCENSE.  2 drops under tongue three times daily.    [name] Kitchen OVER THE COUNTER MEDICATION JUNIPER BERRY AND GRAPEFRUIT.  Mix together take in 1 veggie capsule daily.    [name] Kitchen OVER THE COUNTER MEDICATION LAVENDER AND YLANG YLANG. Apply to bottom of feet once a day.    . oxyCODONE (OXYCONTIN) 20 mg 12 hr tablet Take 20 mg by mouth every 8 (eight) hours.    . Oxycodone HCl 10 MG TABS Take 1 tablet (10 mg total) by mouth every 4 (four) hours as needed. 40 tablet 0  . polyethylene glycol (MIRALAX / GLYCOLAX) packet Take 17 g by mouth daily as  needed. Reported on 04/30/2015    . PRESCRIPTION MEDICATION Chemo at [facility].  Oncologist Tessie Eke.    . sennosides-docusate sodium (SENOKOT-S) 8.6-50 MG tablet Take 1 tablet by mouth 2 (two) times daily.    . vitamin E 400 UNIT capsule Take 400 Units by mouth daily.     No current facility-administered medications for this encounter.     ALLERGIES: Adhesive   LABORATORY DATA:  Lab Results  Component Value Date   WBC 6.4 04/05/2015   HGB 10.8* 04/05/2015   HCT 31.0* 04/05/2015   MCV 79.5 04/05/2015   PLT 57* 04/05/2015   Lab Results  Component Value Date   NA 139 04/05/2015   K 4.0 04/05/2015   CL 106 04/05/2015   CO2 27 04/05/2015   Lab Results  Component Value Date   ALT 16 04/04/2015   AST 24 04/04/2015   ALKPHOS 82 04/04/2015   BILITOT 1.2 04/04/2015     NARRATIVE: Ruth Maldonado was seen today for weekly treatment management. The chart was checked and the patient's films were reviewed.  On review of systems the patient reports that she is doing well overall , and states that she has regained more functionality of her left lower extremity since the radiation began. Her pain is  still present and she is taking oxycodone as needed however she finds that pain intensity has improved. She denies any difficulty with dysuria or hematuria. She is urinating well at this time.  No other complaints or verbalized.  PHYSICAL EXAMINATION: height is 5' 5.5" (1.664 m) and weight is 154 lb (69.854 kg). Her oral temperature is 97.8 F (36.6 C). Her blood pressure is 115/68 and her pulse is 89. Her oxygen saturation is 100%.       Pain scale: 0/10 In general this is a well-appearing African-American female in no acute distress. She is alert and oriented 4 in appropriate throughout the examination. The left lower extremity evaluated and she has 4+ strength inability to lift her entire leg where previously this was very difficult for her to do.  DTRs are intact bilaterally in the lower  extremities.  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  She is receiving radiation to the left femur and the bladder, and appears to be doing very well with tolerating this. We will continue to follow her closely and she will keep [place] informed of any questions or concerns prior to next week's visit.   Jodelle Gross, MD, PhD

## 2015-05-07 NOTE — Patient Instructions (Signed)
Contact our office if you have any questions following today's appointment: [phone].  

## 2015-05-10 ENCOUNTER — Ambulatory Visit
Admission: RE | Admit: 2015-05-10 | Discharge: 2015-05-10 | Disposition: A | Discharge status: Home / Self Care | Payer: Medicaid Other | Source: Ambulatory Visit | Attending: Radiation Oncology | Admitting: Radiation Oncology

## 2015-05-10 DIAGNOSIS — Z510 Encounter for antineoplastic radiation therapy: Secondary | ICD-10-CM | POA: Diagnosis not present

## 2015-05-11 ENCOUNTER — Ambulatory Visit
Admission: RE | Admit: 2015-05-11 | Discharge: 2015-05-11 | Disposition: A | Discharge status: Home / Self Care | Payer: Medicaid Other | Source: Ambulatory Visit | Attending: Radiation Oncology | Admitting: Radiation Oncology

## 2015-05-11 DIAGNOSIS — Z510 Encounter for antineoplastic radiation therapy: Secondary | ICD-10-CM | POA: Diagnosis not present

## 2015-05-12 ENCOUNTER — Ambulatory Visit
Admission: RE | Admit: 2015-05-12 | Discharge: 2015-05-12 | Disposition: A | Discharge status: Home / Self Care | Payer: Medicaid Other | Source: Ambulatory Visit | Attending: Radiation Oncology | Admitting: Radiation Oncology

## 2015-05-12 DIAGNOSIS — Z510 Encounter for antineoplastic radiation therapy: Secondary | ICD-10-CM | POA: Diagnosis not present

## 2015-05-13 ENCOUNTER — Ambulatory Visit
Admission: RE | Admit: 2015-05-13 | Discharge: 2015-05-13 | Disposition: A | Discharge status: Home / Self Care | Payer: Medicaid Other | Source: Ambulatory Visit | Attending: Radiation Oncology | Admitting: Radiation Oncology

## 2015-05-13 DIAGNOSIS — Z510 Encounter for antineoplastic radiation therapy: Secondary | ICD-10-CM | POA: Diagnosis not present

## 2015-05-14 ENCOUNTER — Ambulatory Visit
Admission: RE | Admit: 2015-05-14 | Discharge: 2015-05-14 | Disposition: A | Discharge status: Home / Self Care | Payer: Medicaid Other | Source: Ambulatory Visit | Attending: Radiation Oncology | Admitting: Radiation Oncology

## 2015-05-14 ENCOUNTER — Encounter: Payer: Self-pay | Admitting: Radiation Oncology

## 2015-05-14 VITALS — BP 132/82 | HR 108 | Temp 98.6°F | Resp 20 | Wt 153.1 lb

## 2015-05-14 DIAGNOSIS — C7951 Secondary malignant neoplasm of bone: Secondary | ICD-10-CM

## 2015-05-14 DIAGNOSIS — Z510 Encounter for antineoplastic radiation therapy: Secondary | ICD-10-CM | POA: Diagnosis not present

## 2015-05-14 NOTE — Progress Notes (Signed)
Weekly rad txs bladder and left femur 12/15 completed   No dysuria or hematuria, bowels regular, pain has decreased in left femur, says is stronger and is walking some better , still fatigued;, has appt at [facility] Wednesday next week, immunotherapy  BP 132/82 mmHg  Pulse 108  Temp(Src) 98.6 F (37 C) (Oral)  Resp 20  Wt 153 lb 1.6 oz (69.446 kg)  Wt Readings from Last 3 Encounters:  05/14/15 153 lb 1.6 oz (69.446 kg)  05/07/15 154 lb (69.854 kg)  04/15/15 160 lb 8 oz (72.802 kg)

## 2015-05-14 NOTE — Progress Notes (Signed)
Department of Radiation Oncology  Phone:  [phone] Fax:        [phone]  Weekly Treatment Note    Name: Ruth Maldonado Date: 05/14/2015 MRN: [id number] DOB: Jul 31, 1955   Diagnosis:     ICD-9-CM ICD-10-CM   1. Bone metastasis (HCC) 198.5 C79.51      Current dose: 30 Gy  Current fraction: 12   MEDICATIONS: Current Outpatient Prescriptions  Medication Sig Dispense Refill  . Cholecalciferol (VITAMIN D3) 1000 UNITS CAPS Take 1 capsule by mouth daily.    Ruth Maldonado Sodium (STOOL SOFTENER) 100 MG capsule Take 100 mg by mouth 2 (two) times daily.    . hyaluronate sodium (RADIAPLEXRX) GEL Apply 1 application topically daily.    . hydrochlorothiazide (HYDRODIURIL) 25 MG tablet TAKE ONE TABLET BY MOUTH ONCE DAILY 30 tablet 5  . ibuprofen (ADVIL,MOTRIN) 200 MG tablet Take 200 mg by mouth every 6 (six) hours as needed for mild pain or moderate pain.    [name] Kitchen LORazepam (ATIVAN) 0.5 MG tablet Take 0.5 mg by mouth every 6 (six) hours as needed for anxiety.    [name] Kitchen losartan (COZAAR) 50 MG tablet TAKE ONE TABLET BY MOUTH ONCE DAILY 30 tablet 5  . milk thistle 175 MG tablet Take 175 mg by mouth daily as needed. Reported on 04/30/2015    . OVER THE COUNTER MEDICATION TUMERIC.  1 dropper per day.    [name] Kitchen OVER THE COUNTER MEDICATION Apply topically. LEMON GRASS ESSENTIAL OILS.  Once a day.    [name] Kitchen OVER THE COUNTER MEDICATION FRANKINCENSE.  2 drops under tongue three times daily.    [name] Kitchen OVER THE COUNTER MEDICATION JUNIPER BERRY AND GRAPEFRUIT.  Mix together take in 1 veggie capsule daily.    [name] Kitchen OVER THE COUNTER MEDICATION LAVENDER AND YLANG YLANG. Apply to bottom of feet once a day.    . oxyCODONE (OXYCONTIN) 20 mg 12 hr tablet Take 20 mg by mouth every 8 (eight) hours.    . Oxycodone HCl 10 MG TABS Take 1 tablet (10 mg total) by mouth every 4 (four) hours as needed. 40 tablet 0  . polyethylene glycol (MIRALAX / GLYCOLAX) packet Take 17 g by mouth daily as needed. Reported on 04/30/2015    .  PRESCRIPTION MEDICATION Chemo at [facility].  Oncologist Ruth Maldonado.    . sennosides-docusate sodium (SENOKOT-S) 8.6-50 MG tablet Take 1 tablet by mouth 2 (two) times daily.    . vitamin E 400 UNIT capsule Take 400 Units by mouth daily.     No current facility-administered medications for this encounter.     ALLERGIES: Adhesive   LABORATORY DATA:  Lab Results  Component Value Date   WBC 6.4 04/05/2015   HGB 10.8* 04/05/2015   HCT 31.0* 04/05/2015   MCV 79.5 04/05/2015   PLT 57* 04/05/2015   Lab Results  Component Value Date   NA 139 04/05/2015   K 4.0 04/05/2015   CL 106 04/05/2015   CO2 27 04/05/2015   Lab Results  Component Value Date   ALT 16 04/04/2015   AST 24 04/04/2015   ALKPHOS 82 04/04/2015   BILITOT 1.2 04/04/2015     NARRATIVE: Ruth Maldonado was seen today for weekly treatment management. The chart was checked and the patient's films were reviewed.  Weekly rad txs bladder and left femur 12/15 completed   No dysuria or hematuria, bowels regular, pain has decreased in left femur, says is stronger and is walking some better , still fatigued;, has appt at [facility]  Wednesday next week, immunotherapy  BP 132/82 mmHg  Pulse 108  Temp(Src) 98.6 F (37 C) (Oral)  Resp 20  Wt 153 lb 1.6 oz (69.446 kg)  Wt Readings from Last 3 Encounters:  05/14/15 153 lb 1.6 oz (69.446 kg)  05/07/15 154 lb (69.854 kg)  04/15/15 160 lb 8 oz (72.802 kg)    PHYSICAL EXAMINATION: weight is 153 lb 1.6 oz (69.446 kg). Her oral temperature is 98.6 F (37 C). Her blood pressure is 132/82 and her pulse is 108. Her respiration is 20.        ASSESSMENT: The patient is doing satisfactorily with treatment.  PLAN: We will continue with the patient's radiation treatment as planned. The patient will finish next week and follow-up in our clinic in 1 month.

## 2015-05-17 ENCOUNTER — Ambulatory Visit
Admission: RE | Admit: 2015-05-17 | Discharge: 2015-05-17 | Disposition: A | Discharge status: Home / Self Care | Payer: Medicaid Other | Source: Ambulatory Visit | Attending: Radiation Oncology | Admitting: Radiation Oncology

## 2015-05-17 DIAGNOSIS — Z510 Encounter for antineoplastic radiation therapy: Secondary | ICD-10-CM | POA: Diagnosis not present

## 2015-05-18 ENCOUNTER — Ambulatory Visit
Admission: RE | Admit: 2015-05-18 | Discharge: 2015-05-18 | Disposition: A | Discharge status: Home / Self Care | Payer: Medicaid Other | Source: Ambulatory Visit | Attending: Radiation Oncology | Admitting: Radiation Oncology

## 2015-05-18 ENCOUNTER — Ambulatory Visit: Payer: Medicaid Other

## 2015-05-18 DIAGNOSIS — Z510 Encounter for antineoplastic radiation therapy: Secondary | ICD-10-CM | POA: Diagnosis not present

## 2015-05-19 ENCOUNTER — Ambulatory Visit: Payer: Medicaid Other

## 2015-05-20 ENCOUNTER — Encounter: Payer: Self-pay | Admitting: Radiation Oncology

## 2015-05-20 ENCOUNTER — Ambulatory Visit
Admission: RE | Admit: 2015-05-20 | Discharge: 2015-05-20 | Disposition: A | Discharge status: Home / Self Care | Payer: Medicaid Other | Source: Ambulatory Visit | Attending: Radiation Oncology | Admitting: Radiation Oncology

## 2015-05-20 DIAGNOSIS — Z510 Encounter for antineoplastic radiation therapy: Secondary | ICD-10-CM | POA: Diagnosis not present

## 2015-06-11 NOTE — Progress Notes (Signed)
  Radiation Oncology         [phone] ________________________________  Name: Matsuko Zipkin MRN: [id number]  Date: 05/20/2015  DOB: [birth year]  End of Treatment Note  Diagnosis:   Metastatic bladder cancer     Indication for treatment::  palliative       Radiation treatment dates:   04/29/15 - 05/20/15  Site/dose:   The patient was treated to the bladder and proximal left femur concurrently to a dose of 37.5 Gy using a 4-field 3D conformal technique. Daily image guidance was used and the patient received concurrent chemotherapy.  Narrative: The patient tolerated radiation treatment relatively well.   Her pain in the left hip/ leg markedly improved.  Plan: The patient has completed radiation treatment. The patient will return to radiation oncology clinic for routine followup in one month. I advised the patient to call or return sooner if they have any questions or concerns related to their recovery or treatment. ________________________________  Jodelle Gross, M.D., Ph.D.

## 2015-06-16 ENCOUNTER — Ambulatory Visit
Admission: RE | Admit: 2015-06-16 | Discharge: 2015-06-16 | Disposition: A | Discharge status: Home / Self Care | Payer: Medicaid Other | Source: Ambulatory Visit | Attending: Radiation Oncology | Admitting: Radiation Oncology

## 2015-06-16 ENCOUNTER — Telehealth: Payer: Self-pay | Admitting: *Deleted

## 2015-06-16 NOTE — Telephone Encounter (Signed)
Called the patient home, her appt was for 1000am,  she sounded very hoarse, stated she was sick and was sorry but cancelling todays follow up visit, , asked if she wanted to reschedule now , "No, I'll call and reschedule when I'm feeling better", thanked me for the call, will let MD know 10:17 AM

## 2015-06-29 ENCOUNTER — Encounter: Payer: Self-pay | Admitting: Radiation Oncology

## 2015-07-01 ENCOUNTER — Ambulatory Visit: Admission: RE | Admit: 2015-07-01 | Payer: Medicaid Other | Source: Ambulatory Visit | Admitting: Radiation Oncology

## 2015-07-01 HISTORY — DX: Personal history of irradiation: Z92.3

## 2015-07-21 ENCOUNTER — Ambulatory Visit
Admission: RE | Admit: 2015-07-21 | Discharge: 2015-07-21 | Disposition: A | Discharge status: Home / Self Care | Payer: Medicaid Other | Source: Ambulatory Visit | Attending: Radiation Oncology | Admitting: Radiation Oncology

## 2015-07-21 ENCOUNTER — Encounter: Payer: Self-pay | Admitting: Radiation Oncology

## 2015-07-21 VITALS — BP 135/93 | HR 58 | Temp 97.8°F | Resp 16 | Ht 65.5 in | Wt 156.5 lb

## 2015-07-21 DIAGNOSIS — C7951 Secondary malignant neoplasm of bone: Secondary | ICD-10-CM

## 2015-07-21 DIAGNOSIS — C673 Malignant neoplasm of anterior wall of bladder: Secondary | ICD-10-CM

## 2015-07-21 NOTE — Progress Notes (Signed)
Mrs. Zeman  Here today for FU visit for metastatic bladder cancer.  Appetite has improved.  Having fatigue if she does too much activity.  Taking a bladder cancer drug from [facility] called Immutatheraphy IV every weeks.  Last dose 07-12-15.   No change in bladder and bowel  pattern.  Has noticed a aching feeling to the right lower abdomen in the past two weeks.   Denies any nausea or vomiting.  No numbness or weakness of extremities. Wt Readings from Last 3 Encounters:  07/21/15 156 lb 8 oz (70.988 kg)  05/14/15 153 lb 1.6 oz (69.446 kg)  05/07/15 154 lb (69.854 kg)   BP 135/93 mmHg  Pulse 58  Temp(Src) 97.8 F (36.6 C) (Oral)  Resp 16  Ht 5' 5.5" (1.664 m)  Wt 156 lb 8 oz (70.988 kg)  BMI 25.64 kg/m2  SpO2 100%

## 2015-07-21 NOTE — Progress Notes (Signed)
Radiation Oncology         [phone] ________________________________  Name: Ruth Maldonado MRN: [id number]  Date: 07/21/2015  DOB: December 30, 1955  Follow-Up Visit Note  CC: Nance Pear., NP  Debbrah Alar, NP  Diagnosis:   High grade metastatic urothelial carcinoma of the bladder, clinical T2 with metastases to the proximal left femur.  Interval Since Last Radiation:  2 months.  04/29/2015-05/20/2015: bladder and proximal left femur concurrently to a dose of 37.5 Gy. The patient received concurrent chemotherapy.   Narrative:  The patient returns today for routine follow-up. The patient continues on immunotherapy under the care of medical oncology at [facility].  On review of systems, the patient reports that she is doing well overall. She states that she is no longer having pain in her left leg, and states that she has had only some slight soreness when urinating that she experiences in the right pelvis. She denies any chest pain or shortness of breath, fevers or chills. She is not experiencing any dysuria, hematuria or incomplete voiding. No other complaints or verbalized  ALLERGIES:  is allergic to adhesive.  Meds: Current Outpatient Prescriptions  Medication Sig Dispense Refill  . Cholecalciferol (VITAMIN D3) 1000 UNITS CAPS Take 1 capsule by mouth daily.    [name] Sodium (STOOL SOFTENER) 100 MG capsule Take 100 mg by mouth 2 (two) times daily.    . hydrochlorothiazide (HYDRODIURIL) 25 MG tablet TAKE ONE TABLET BY MOUTH ONCE DAILY 30 tablet 5  . ibuprofen (ADVIL,MOTRIN) 200 MG tablet Take 200 mg by mouth every 6 (six) hours as needed for mild pain or moderate pain.    [name] Kitchen LORazepam (ATIVAN) 0.5 MG tablet Take 0.5 mg by mouth every 6 (six) hours as needed for anxiety.    [name] Kitchen losartan (COZAAR) 50 MG tablet TAKE ONE TABLET BY MOUTH ONCE DAILY 30 tablet 5  . milk thistle 175 MG tablet Take 175 mg by mouth daily as needed. Reported on 04/30/2015    . OVER THE COUNTER  MEDICATION TUMERIC.  1 dropper per day.    [name] Kitchen OVER THE COUNTER MEDICATION Apply topically. LEMON GRASS ESSENTIAL OILS.  Once a day.    [name] Kitchen OVER THE COUNTER MEDICATION FRANKINCENSE.  2 drops under tongue three times daily.    [name] Kitchen OVER THE COUNTER MEDICATION JUNIPER BERRY AND GRAPEFRUIT.  Mix together take in 1 veggie capsule daily.    [name] Kitchen OVER THE COUNTER MEDICATION LAVENDER AND YLANG YLANG. Apply to bottom of feet once a day.    . Oxycodone HCl 10 MG TABS Take 1 tablet (10 mg total) by mouth every 4 (four) hours as needed. 40 tablet 0  . polyethylene glycol (MIRALAX / GLYCOLAX) packet Take 17 g by mouth daily as needed. Reported on 04/30/2015    . PRESCRIPTION MEDICATION Chemo at [facility].  Oncologist Tessie Eke.    . sennosides-docusate sodium (SENOKOT-S) 8.6-50 MG tablet Take 1 tablet by mouth 2 (two) times daily.    [name] Kitchen oxyCODONE (OXYCONTIN) 20 mg 12 hr tablet Take 20 mg by mouth every 8 (eight) hours. Reported on 07/21/2015    . vitamin E 400 UNIT capsule Take 400 Units by mouth daily. Reported on 07/21/2015     No current facility-administered medications for this encounter.    Physical Findings:  height is 5' 5.5" (1.664 m) and weight is 156 lb 8 oz (70.988 kg). Her oral temperature is 97.8 F (36.6 C). Her blood pressure is 135/93 and her pulse is 58. Her respiration is  16 and oxygen saturation is 100%. .   Pain scale 0/10 In general this is a well appearing African-American female in no acute distress. She's alert and oriented x4 and appropriate throughout the examination. Cardiopulmonary assessment is negative for acute distress and she exhibits normal effort. The patient is able to ambulate without any assistance. No gait disturbances are appreciated.   Lab Findings: Lab Results  Component Value Date   WBC 6.4 04/05/2015   HGB 10.8* 04/05/2015   HCT 31.0* 04/05/2015   MCV 79.5 04/05/2015   PLT 57* 04/05/2015     Radiographic Findings: No results found.  Impression/Plan: 1. Metastatic  bladder cancer. The patient appears to be doing well from her radiation treatment, and we discussed the rationale for moving forward with subsequent care with her medical oncologist with immunotherapy. We would be happy to see her in the future should there be any questions or concerns regarding her previous radiation treatment. She states agreement and understanding.    Carola Rhine, PAC

## 2015-09-02 NOTE — Progress Notes (Signed)
Recon  High grade metastatic Urothelial cancer of the bladder,T2 mets to left proximal femur, Now right hip mets Referral for palliative radiation to the right hip   Radiation to bladder and left femur 04/29/15-05/20/15 37.5 Gy   Dr. Olivia Mackie L.Kalman Shan, [place],  Referral to liver clinic for Hep C again, was no show for previous appt;  Plan to continue  atezolizumab c 5 08/31/15, consented for UC-genome study ,trial on hold at  Present, Not consented for [id number], RTC 3 weeks for labs,clinic ,consideration of cycle 6 Right hip pain a 4/10 scale this am, took her pin med  This am and is starting to work stated patient, appetite good,  BP 132/70 mmHg  Pulse 61  Temp(Src) 97.7 F (36.5 C) (Oral)  Resp 20  Ht 5' 5.5" (1.664 m)  Wt 159 lb (72.122 kg)  BMI 26.05 kg/m2  SpO2 100%  Wt Readings from Last 3 Encounters:  09/06/15 159 lb (72.122 kg)  07/21/15 156 lb 8 oz (70.988 kg)  05/14/15 153 lb 1.6 oz (69.446 kg)

## 2015-09-03 ENCOUNTER — Inpatient Hospital Stay
Admission: RE | Admit: 2015-09-03 | Discharge: 2015-09-03 | Disposition: A | Discharge status: Home / Self Care | Payer: Self-pay | Source: Ambulatory Visit | Attending: Radiation Oncology | Admitting: Radiation Oncology

## 2015-09-03 ENCOUNTER — Other Ambulatory Visit: Payer: Self-pay | Admitting: Radiation Oncology

## 2015-09-03 DIAGNOSIS — C801 Malignant (primary) neoplasm, unspecified: Secondary | ICD-10-CM

## 2015-09-06 ENCOUNTER — Ambulatory Visit
Admission: RE | Admit: 2015-09-06 | Discharge: 2015-09-06 | Disposition: A | Discharge status: Home / Self Care | Payer: Medicaid Other | Source: Ambulatory Visit | Attending: Radiation Oncology | Admitting: Radiation Oncology

## 2015-09-06 ENCOUNTER — Encounter: Payer: Self-pay | Admitting: Radiation Oncology

## 2015-09-06 VITALS — BP 132/70 | HR 61 | Temp 97.7°F | Resp 20 | Ht 65.5 in | Wt 159.0 lb

## 2015-09-06 DIAGNOSIS — Z510 Encounter for antineoplastic radiation therapy: Secondary | ICD-10-CM | POA: Diagnosis not present

## 2015-09-06 DIAGNOSIS — C7951 Secondary malignant neoplasm of bone: Secondary | ICD-10-CM | POA: Insufficient documentation

## 2015-09-06 DIAGNOSIS — C679 Malignant neoplasm of bladder, unspecified: Secondary | ICD-10-CM | POA: Diagnosis not present

## 2015-09-06 NOTE — Progress Notes (Signed)
Please see the Nurse Progress Note in the MD Initial Consult Encounter for this patient. 

## 2015-09-06 NOTE — Progress Notes (Signed)
Radiation Oncology         [phone] ________________________________  Name: Ruth Maldonado MRN: [id number]  Date: 09/06/2015  DOB: 1956-03-28  Re-Evaluation Visit Note  CC: Nance Pear., NP  Berkley Harvey, MD  Diagnosis:   High grade metastatic urothelial carcinoma of the bladder, clinical T2 with metastases to the proximal left femur and right iliac wing  Interval Since Last Radiation:  4 months  04/29/2015-05/20/2015: bladder and proximal left femur concurrently to a dose of 37.5 Gy. The patient received concurrent chemotherapy.   Narrative:  The patient returns today for a re-evaluation. The patient continues on immunotherapy under the care of medical oncology at [facility]. The patient had a CT of the chest/abd/pelvis with contrast on 08/26/15. This noted the near resolution of a previously seen bladder mass, increased necrotic retroperitoneal adenopathy, a stable subcentimeter hepatic lesion in T7, decrease in size of the left femoral lesion, and right iliac wing metastasis that appears more lytic in nature (measuring 2.9 x 2.8 cm). The patient is to continue with Atezolizumab and has consented for a UC-genome study.  The patient has a 3 weeks history of right hip pain that is a 4/10. She reports a good appetite.  ALLERGIES:  is allergic to adhesive.  Meds: Current Outpatient Prescriptions  Medication Sig Dispense Refill  . Cholecalciferol (VITAMIN D3) 1000 UNITS CAPS Take 1 capsule by mouth daily.    [name] Sodium (STOOL SOFTENER) 100 MG capsule Take 100 mg by mouth 2 (two) times daily.    . hydrochlorothiazide (HYDRODIURIL) 25 MG tablet TAKE ONE TABLET BY MOUTH ONCE DAILY 30 tablet 5  . ibuprofen (ADVIL,MOTRIN) 200 MG tablet Take 200 mg by mouth every 6 (six) hours as needed for mild pain or moderate pain.    [name] Kitchen losartan (COZAAR) 50 MG tablet TAKE ONE TABLET BY MOUTH ONCE DAILY 30 tablet 5  . milk thistle 175 MG tablet Take 175 mg by mouth daily as needed.  Reported on 04/30/2015    . OVER THE COUNTER MEDICATION TUMERIC.  1 dropper per day.    [name] Kitchen OVER THE COUNTER MEDICATION Apply topically. LEMON GRASS ESSENTIAL OILS.  Once a day.    [name] Kitchen OVER THE COUNTER MEDICATION FRANKINCENSE.  2 drops under tongue three times daily.    [name] Kitchen OVER THE COUNTER MEDICATION JUNIPER BERRY AND GRAPEFRUIT.  Mix together take in 1 veggie capsule daily.    [name] Kitchen OVER THE COUNTER MEDICATION LAVENDER AND YLANG YLANG. Apply to bottom of feet once a day.    . oxyCODONE (OXYCONTIN) 20 mg 12 hr tablet Take 20 mg by mouth every 12 (twelve) hours.    . Oxycodone HCl 10 MG TABS Take 1 tablet (10 mg total) by mouth every 4 (four) hours as needed. 40 tablet 0  . polyethylene glycol (MIRALAX / GLYCOLAX) packet Take 17 g by mouth daily as needed. Reported on 04/30/2015    . PRESCRIPTION MEDICATION Chemo at [facility].  Oncologist Tessie Eke.    . sennosides-docusate sodium (SENOKOT-S) 8.6-50 MG tablet Take 1 tablet by mouth 2 (two) times daily.    . vitamin E 400 UNIT capsule Take 400 Units by mouth daily. Reported on 07/21/2015    . LORazepam (ATIVAN) 0.5 MG tablet Take 0.5 mg by mouth every 6 (six) hours as needed for anxiety. Reported on 09/06/2015     No current facility-administered medications for this encounter.    Physical Findings:  height is 5' 5.5" (1.664 m) and weight is 159 lb (  72.122 kg). Her oral temperature is 97.7 F (36.5 C). Her blood pressure is 132/70 and her pulse is 61. Her respiration is 20 and oxygen saturation is 100%. .   Pain scale 4/10 In general this is a well appearing African-American female in no acute distress. She's alert and oriented x4 and appropriate throughout the examination. Cardiopulmonary assessment is negative for acute distress and she exhibits normal effort. The patient is able to ambulate without any assistance. No gait disturbances are appreciated.   Lab Findings: Lab Results  Component Value Date   WBC 6.4 04/05/2015   HGB 10.8* 04/05/2015   HCT 31.0*  04/05/2015   MCV 79.5 04/05/2015   PLT 57* 04/05/2015     Radiographic Findings: Ct Outside Films Chest  09/03/2015  CLINICAL DATA:  This exam is stored here for comparison purposes only and was performed at an outside facility.   Please contact the originating institution for any associated interpretation or report.   Ct Outside Films Body  09/03/2015  CLINICAL DATA:  This exam is stored here for comparison purposes only and was performed at an outside facility.   Please contact the originating institution for any associated interpretation or report.   Impression/Plan: 1. Metastatic bladder cancer. The patient appears to be doing well from her radiation treatment, and we discussed the rationale for moving forward with subsequent care with her medical oncologist with immunotherapy. In regards to the metastasis in her right iliac wing, palliative radiation could be an option. The patient agrees with treating this area. The patient signed a consent form and this was placed in her medical chart. CT simulation has been scheduled after this encounter. She will complete 10 fractions of radiation therapy as an outpatient.  ------------------------------------------------  Jodelle Gross, MD, PhD  This document serves as a record of services personally performed by Kyung Rudd, MD. It was created on his behalf by Darcus Austin, a trained medical scribe. The creation of this record is based on the scribe's personal observations and the provider's statements to them. This document has been checked and approved by the attending provider.

## 2015-09-08 DIAGNOSIS — Z510 Encounter for antineoplastic radiation therapy: Secondary | ICD-10-CM | POA: Diagnosis not present

## 2015-09-09 ENCOUNTER — Ambulatory Visit
Admission: RE | Admit: 2015-09-09 | Discharge: 2015-09-09 | Disposition: A | Discharge status: Home / Self Care | Payer: Medicaid Other | Source: Ambulatory Visit | Attending: Radiation Oncology | Admitting: Radiation Oncology

## 2015-09-09 DIAGNOSIS — Z510 Encounter for antineoplastic radiation therapy: Secondary | ICD-10-CM | POA: Diagnosis not present

## 2015-09-10 ENCOUNTER — Ambulatory Visit
Admission: RE | Admit: 2015-09-10 | Discharge: 2015-09-10 | Disposition: A | Discharge status: Home / Self Care | Payer: Medicaid Other | Source: Ambulatory Visit | Attending: Radiation Oncology | Admitting: Radiation Oncology

## 2015-09-10 ENCOUNTER — Encounter: Payer: Self-pay | Admitting: Radiation Oncology

## 2015-09-10 VITALS — BP 127/64 | HR 85 | Temp 97.8°F | Resp 16 | Wt 157.2 lb

## 2015-09-10 DIAGNOSIS — Z510 Encounter for antineoplastic radiation therapy: Secondary | ICD-10-CM | POA: Diagnosis not present

## 2015-09-10 DIAGNOSIS — C7951 Secondary malignant neoplasm of bone: Secondary | ICD-10-CM

## 2015-09-10 NOTE — Progress Notes (Addendum)
Weekly rad txs 2/10 pelvis right hip, pt education done, discussed ways to manage side effects, pain, skin irritation,pain,fatigue, diarrhea, verbal understanding, gave my business card, and radiation therapy and you book, patient  took pain med and that helps low right back pain, verbal understanding, teach back given BP 127/64 mmHg  Pulse 85  Temp(Src) 97.8 F (36.6 C) (Oral)  Resp 16  Wt 157 lb 3.2 oz (71.305 kg)  SpO2 100%  Wt Readings from Last 3 Encounters:  09/10/15 157 lb 3.2 oz (71.305 kg)  09/06/15 159 lb (72.122 kg)  07/21/15 156 lb 8 oz (70.988 kg)

## 2015-09-12 ENCOUNTER — Encounter: Payer: Self-pay | Admitting: Radiation Oncology

## 2015-09-12 NOTE — Progress Notes (Signed)
Department of Radiation Oncology  Phone:  [phone] Fax:        [phone]  Weekly Treatment Note    Name: Ruth Maldonado Date: 09/12/2015 MRN: [id number] DOB: 1955-05-28   Diagnosis:     ICD-9-CM ICD-10-CM   1. Bone metastasis (HCC) 198.5 C79.51      Current dose: 6 Gy  Current fraction: 2   MEDICATIONS: Current Outpatient Prescriptions  Medication Sig Dispense Refill  . Cholecalciferol (VITAMIN D3) 1000 UNITS CAPS Take 1 capsule by mouth daily.    [name] Sodium (STOOL SOFTENER) 100 MG capsule Take 100 mg by mouth 2 (two) times daily.    . hydrochlorothiazide (HYDRODIURIL) 25 MG tablet TAKE ONE TABLET BY MOUTH ONCE DAILY 30 tablet 5  . ibuprofen (ADVIL,MOTRIN) 200 MG tablet Take 200 mg by mouth every 6 (six) hours as needed for mild pain or moderate pain.    [name] Kitchen LORazepam (ATIVAN) 0.5 MG tablet Take 0.5 mg by mouth every 6 (six) hours as needed for anxiety. Reported on 09/06/2015    . losartan (COZAAR) 50 MG tablet TAKE ONE TABLET BY MOUTH ONCE DAILY 30 tablet 5  . milk thistle 175 MG tablet Take 175 mg by mouth daily as needed. Reported on 04/30/2015    . OVER THE COUNTER MEDICATION TUMERIC.  1 dropper per day.    [name] Kitchen OVER THE COUNTER MEDICATION Apply topically. LEMON GRASS ESSENTIAL OILS.  Once a day.    [name] Kitchen OVER THE COUNTER MEDICATION FRANKINCENSE.  2 drops under tongue three times daily.    [name] Kitchen OVER THE COUNTER MEDICATION JUNIPER BERRY AND GRAPEFRUIT.  Mix together take in 1 veggie capsule daily.    [name] Kitchen OVER THE COUNTER MEDICATION LAVENDER AND YLANG YLANG. Apply to bottom of feet once a day.    . oxyCODONE (OXYCONTIN) 20 mg 12 hr tablet Take 20 mg by mouth every 12 (twelve) hours.    . Oxycodone HCl 10 MG TABS Take 1 tablet (10 mg total) by mouth every 4 (four) hours as needed. 40 tablet 0  . polyethylene glycol (MIRALAX / GLYCOLAX) packet Take 17 g by mouth daily as needed. Reported on 04/30/2015    . PRESCRIPTION MEDICATION Chemo at [facility].  Oncologist Tessie Eke.    .  sennosides-docusate sodium (SENOKOT-S) 8.6-50 MG tablet Take 1 tablet by mouth 2 (two) times daily.    . vitamin E 400 UNIT capsule Take 400 Units by mouth daily. Reported on 07/21/2015     No current facility-administered medications for this encounter.     ALLERGIES: Adhesive   LABORATORY DATA:  Lab Results  Component Value Date   WBC 6.4 04/05/2015   HGB 10.8* 04/05/2015   HCT 31.0* 04/05/2015   MCV 79.5 04/05/2015   PLT 57* 04/05/2015   Lab Results  Component Value Date   NA 139 04/05/2015   K 4.0 04/05/2015   CL 106 04/05/2015   CO2 27 04/05/2015   Lab Results  Component Value Date   ALT 16 04/04/2015   AST 24 04/04/2015   ALKPHOS 82 04/04/2015   BILITOT 1.2 04/04/2015     NARRATIVE: Shataria Kroon was seen today for weekly treatment management. The chart was checked and the patient's films were reviewed.   Weekly rad txs 2/10 pelvis right hip, pt education done, discussed ways to manage side effects, pain, skin irritation,pain,fatigue, diarrhea, verbal understanding, gave my business card, and radiation therapy and you book, patient  took pain med and that helps low right back pain, verbal  understanding, teach back given BP 127/64 mmHg  Pulse 85  Temp(Src) 97.8 F (36.6 C) (Oral)  Resp 16  Wt 157 lb 3.2 oz (71.305 kg)  SpO2 100%  Wt Readings from Last 3 Encounters:  09/10/15 157 lb 3.2 oz (71.305 kg)  09/06/15 159 lb (72.122 kg)  07/21/15 156 lb 8 oz (70.988 kg)    PHYSICAL EXAMINATION: weight is 157 lb 3.2 oz (71.305 kg). Her oral temperature is 97.8 F (36.6 C). Her blood pressure is 127/64 and her pulse is 85. Her respiration is 16 and oxygen saturation is 100%.        ASSESSMENT: The patient is doing satisfactorily with treatment.  PLAN: We will continue with the patient's radiation treatment as planned.

## 2015-09-13 ENCOUNTER — Ambulatory Visit
Admission: RE | Admit: 2015-09-13 | Discharge: 2015-09-13 | Disposition: A | Discharge status: Home / Self Care | Payer: Medicaid Other | Source: Ambulatory Visit | Attending: Radiation Oncology | Admitting: Radiation Oncology

## 2015-09-13 DIAGNOSIS — Z510 Encounter for antineoplastic radiation therapy: Secondary | ICD-10-CM | POA: Diagnosis not present

## 2015-09-14 ENCOUNTER — Ambulatory Visit
Admission: RE | Admit: 2015-09-14 | Discharge: 2015-09-14 | Disposition: A | Discharge status: Home / Self Care | Payer: Medicaid Other | Source: Ambulatory Visit | Attending: Radiation Oncology | Admitting: Radiation Oncology

## 2015-09-14 DIAGNOSIS — Z510 Encounter for antineoplastic radiation therapy: Secondary | ICD-10-CM | POA: Diagnosis not present

## 2015-09-15 ENCOUNTER — Ambulatory Visit
Admission: RE | Admit: 2015-09-15 | Discharge: 2015-09-15 | Disposition: A | Discharge status: Home / Self Care | Payer: Medicaid Other | Source: Ambulatory Visit | Attending: Radiation Oncology | Admitting: Radiation Oncology

## 2015-09-15 DIAGNOSIS — Z510 Encounter for antineoplastic radiation therapy: Secondary | ICD-10-CM | POA: Diagnosis not present

## 2015-09-16 ENCOUNTER — Ambulatory Visit
Admission: RE | Admit: 2015-09-16 | Discharge: 2015-09-16 | Disposition: A | Discharge status: Home / Self Care | Payer: Medicaid Other | Source: Ambulatory Visit | Attending: Radiation Oncology | Admitting: Radiation Oncology

## 2015-09-16 DIAGNOSIS — Z510 Encounter for antineoplastic radiation therapy: Secondary | ICD-10-CM | POA: Diagnosis not present

## 2015-09-17 ENCOUNTER — Ambulatory Visit
Admission: RE | Admit: 2015-09-17 | Discharge: 2015-09-17 | Disposition: A | Discharge status: Home / Self Care | Payer: Medicaid Other | Source: Ambulatory Visit | Attending: Radiation Oncology | Admitting: Radiation Oncology

## 2015-09-17 ENCOUNTER — Encounter: Payer: Self-pay | Admitting: Radiation Oncology

## 2015-09-17 VITALS — BP 143/93 | HR 68 | Temp 97.8°F | Resp 16 | Wt 157.2 lb

## 2015-09-17 DIAGNOSIS — C7951 Secondary malignant neoplasm of bone: Secondary | ICD-10-CM

## 2015-09-17 DIAGNOSIS — Z510 Encounter for antineoplastic radiation therapy: Secondary | ICD-10-CM | POA: Diagnosis not present

## 2015-09-17 NOTE — Progress Notes (Signed)
Department of Radiation Oncology  Phone:  [phone] Fax:        [phone]  Weekly Treatment Note    Name: Ruth Maldonado Date: 09/18/2015 MRN: [id number] DOB: [birth year]   Diagnosis:     ICD-9-CM ICD-10-CM   1. Bone metastasis (HCC) 198.5 C79.51      Current dose: 21 Gy  Current fraction: 7   MEDICATIONS: Current Outpatient Prescriptions  Medication Sig Dispense Refill  . Cholecalciferol (VITAMIN D3) 1000 UNITS CAPS Take 1 capsule by mouth daily.    [name] Sodium (STOOL SOFTENER) 100 MG capsule Take 100 mg by mouth 2 (two) times daily.    . hydrochlorothiazide (HYDRODIURIL) 25 MG tablet TAKE ONE TABLET BY MOUTH ONCE DAILY 30 tablet 5  . ibuprofen (ADVIL,MOTRIN) 200 MG tablet Take 200 mg by mouth every 6 (six) hours as needed for mild pain or moderate pain.    [name] Kitchen LORazepam (ATIVAN) 0.5 MG tablet Take 0.5 mg by mouth every 6 (six) hours as needed for anxiety. Reported on 09/06/2015    . losartan (COZAAR) 50 MG tablet TAKE ONE TABLET BY MOUTH ONCE DAILY 30 tablet 5  . milk thistle 175 MG tablet Take 175 mg by mouth daily as needed. Reported on 04/30/2015    . OVER THE COUNTER MEDICATION TUMERIC.  1 dropper per day.    [name] Kitchen OVER THE COUNTER MEDICATION Apply topically. LEMON GRASS ESSENTIAL OILS.  Once a day.    [name] Kitchen OVER THE COUNTER MEDICATION FRANKINCENSE.  2 drops under tongue three times daily.    [name] Kitchen OVER THE COUNTER MEDICATION JUNIPER BERRY AND GRAPEFRUIT.  Mix together take in 1 veggie capsule daily.    [name] Kitchen OVER THE COUNTER MEDICATION LAVENDER AND YLANG YLANG. Apply to bottom of feet once a day.    . oxyCODONE (OXYCONTIN) 20 mg 12 hr tablet Take 20 mg by mouth every 12 (twelve) hours.    . Oxycodone HCl 10 MG TABS Take 1 tablet (10 mg total) by mouth every 4 (four) hours as needed. 40 tablet 0  . polyethylene glycol (MIRALAX / GLYCOLAX) packet Take 17 g by mouth daily as needed. Reported on 04/30/2015    . PRESCRIPTION MEDICATION Chemo at [facility].  Oncologist Tessie Eke.    .  sennosides-docusate sodium (SENOKOT-S) 8.6-50 MG tablet Take 1 tablet by mouth 2 (two) times daily.    . vitamin E 400 UNIT capsule Take 400 Units by mouth daily. Reported on 07/21/2015     No current facility-administered medications for this encounter.     ALLERGIES: Adhesive   LABORATORY DATA:  Lab Results  Component Value Date   WBC 6.4 04/05/2015   HGB 10.8* 04/05/2015   HCT 31.0* 04/05/2015   MCV 79.5 04/05/2015   PLT 57* 04/05/2015   Lab Results  Component Value Date   NA 139 04/05/2015   K 4.0 04/05/2015   CL 106 04/05/2015   CO2 27 04/05/2015   Lab Results  Component Value Date   ALT 16 04/04/2015   AST 24 04/04/2015   ALKPHOS 82 04/04/2015   BILITOT 1.2 04/04/2015     NARRATIVE: Ruth Maldonado was seen today for weekly treatment management. The chart was checked and the patient's films were reviewed.  She reports a sore and achy bladder. Denies frequency or hematuria. Has nocturia x2, regular bowels. Reportsright hip soreness when walking.  BP 143/93 mmHg  Pulse 68  Temp(Src) 97.8 F (36.6 C) (Oral)  Resp 16  Wt 157 lb 3.2 oz (71.305 kg)  Wt Readings from Last 3 Encounters:  09/17/15 157 lb 3.2 oz (71.305 kg)  09/10/15 157 lb 3.2 oz (71.305 kg)  09/06/15 159 lb (72.122 kg)    PHYSICAL EXAMINATION: weight is 157 lb 3.2 oz (71.305 kg). Her oral temperature is 97.8 F (36.6 C). Her blood pressure is 143/93 and her pulse is 68. Her respiration is 16.        ASSESSMENT: The patient is doing satisfactorily with treatment.  PLAN: We will continue with the patient's radiation treatment as planned.   ------------------------------------------------  Jodelle Gross, MD, PhD  This document serves as a record of services personally performed by Kyung Rudd, MD. It was created on his behalf by Darcus Austin, a trained medical scribe. The creation of this record is based on the scribe's personal observations and the provider's statements to them. This document  has been checked and approved by the attending provider.

## 2015-09-17 NOTE — Progress Notes (Addendum)
Weekly rad txs pelvis, right , 7/10 txs, bladdr sore and achy, no frequency or hematuria, nocturia x2, regular bowels, right hip   Has a glitch there when walking and soreness  BP 143/93 mmHg  Pulse 68  Temp(Src) 97.8 F (36.6 C) (Oral)  Resp 16  Wt 157 lb 3.2 oz (71.305 kg)  Wt Readings from Last 3 Encounters:  09/17/15 157 lb 3.2 oz (71.305 kg)  09/10/15 157 lb 3.2 oz (71.305 kg)  09/06/15 159 lb (72.122 kg)   2:12 PM

## 2015-09-20 ENCOUNTER — Ambulatory Visit: Payer: Medicaid Other

## 2015-09-20 ENCOUNTER — Ambulatory Visit
Admission: RE | Admit: 2015-09-20 | Discharge: 2015-09-20 | Disposition: A | Discharge status: Home / Self Care | Payer: Medicaid Other | Source: Ambulatory Visit | Attending: Radiation Oncology | Admitting: Radiation Oncology

## 2015-09-21 ENCOUNTER — Ambulatory Visit
Admission: RE | Admit: 2015-09-21 | Discharge: 2015-09-21 | Disposition: A | Discharge status: Home / Self Care | Payer: Medicaid Other | Source: Ambulatory Visit | Attending: Radiation Oncology | Admitting: Radiation Oncology

## 2015-09-21 DIAGNOSIS — Z510 Encounter for antineoplastic radiation therapy: Secondary | ICD-10-CM | POA: Diagnosis not present

## 2015-09-22 ENCOUNTER — Ambulatory Visit: Payer: Medicaid Other

## 2015-09-22 ENCOUNTER — Ambulatory Visit
Admission: RE | Admit: 2015-09-22 | Discharge: 2015-09-22 | Disposition: A | Discharge status: Home / Self Care | Payer: Medicaid Other | Source: Ambulatory Visit | Attending: Radiation Oncology | Admitting: Radiation Oncology

## 2015-09-22 DIAGNOSIS — Z510 Encounter for antineoplastic radiation therapy: Secondary | ICD-10-CM | POA: Diagnosis not present

## 2015-09-23 ENCOUNTER — Encounter: Payer: Self-pay | Admitting: Radiation Oncology

## 2015-09-23 ENCOUNTER — Ambulatory Visit
Admission: RE | Admit: 2015-09-23 | Discharge: 2015-09-23 | Disposition: A | Discharge status: Home / Self Care | Payer: Medicaid Other | Source: Ambulatory Visit | Attending: Radiation Oncology | Admitting: Radiation Oncology

## 2015-09-23 DIAGNOSIS — Z510 Encounter for antineoplastic radiation therapy: Secondary | ICD-10-CM | POA: Diagnosis not present

## 2015-09-23 NOTE — Progress Notes (Signed)
Patient left without being sent to nursing for assessment and for to get a one month follow up appt, informed Shona Simpson  [facility] when I cam in to work at 1-3-am, asked Romie Jumper to make an appt and to mail card to the patient 10:57 AM

## 2015-10-14 NOTE — Progress Notes (Signed)
°  Radiation Oncology         [phone] ________________________________  Name: Ruth Maldonado MRN: [id number]  Date: 09/23/2015  DOB: July 18, 1955  End of Treatment Note  Diagnosis:      ICD-9-CM ICD-10-CM   1. Bone metastasis ([place]) 198.5 C79.51              Indication for treatment:  Curative       Radiation treatment dates:   09/09/2015 to 09/23/2015  Site/dose:   The Right pelvis was treated to 30 Gy in 10 fractions at 3 Gy per fraction.  Beams/energy:   3D // 15X, 6X  Narrative: The patient tolerated radiation treatment relatively well.  The patient reports a sore, achy bladder and nocturia x2. She denies frequency or hematuria. She also reports right hip soreness when walking.   Plan: The patient has completed radiation treatment. The patient will return to radiation oncology clinic for routine followup in one month. I advised them to call or return sooner if they have any questions or concerns related to their recovery or treatment.  ------------------------------------------------  Jodelle Gross, MD, PhD  This document serves as a record of services personally performed by Kyung Rudd, MD. It was created on his behalf by Arlyce Harman, a trained medical scribe. The creation of this record is based on the scribe's personal observations and the provider's statements to them. This document has been checked and approved by the attending provider.

## 2015-10-27 IMAGING — CT CT RENAL STONE PROTOCOL
2 of 4 series · 15 of 46 positions shown, 17 images · non-contrast
Comparison: 06/07/2014

CLINICAL DATA: Pelvic pain, recently passed a kidney stone, history
of bladder cancer not treated, status post hysterectomy,
transurethral tumour bladder resection

EXAM:
CT ABDOMEN AND PELVIS WITHOUT CONTRAST
TECHNIQUE: Multidetector CT imaging of the abdomen and pelvis was performed
following the standard protocol without IV contrast.

[Series 2: renal stone > 200 lbs 5.0 b31f · axial · 0.80mm/px · z∈[-461,-41]mm · 12 of 92 slices shown, 14 images]
[im 4/92  soft-tissue]
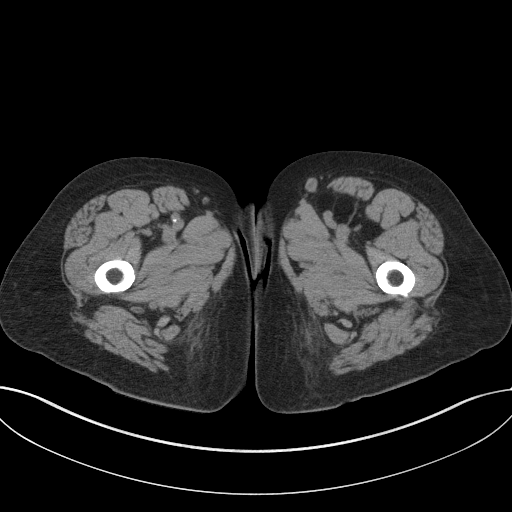
[im 4/92  bone]
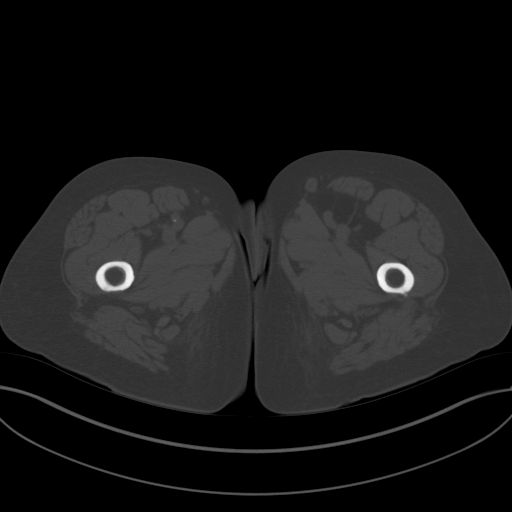
[im 12/92  soft-tissue]
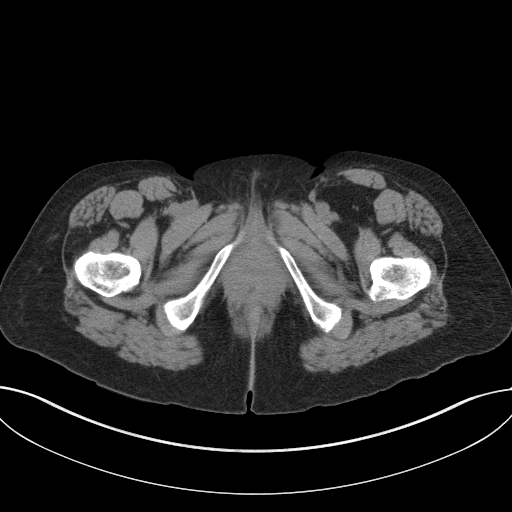
[im 19/92  soft-tissue]
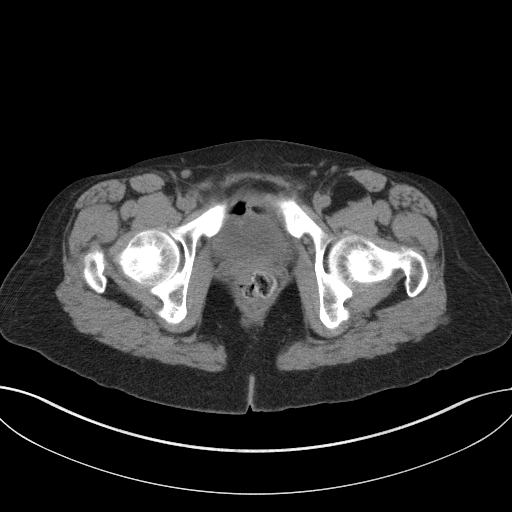
[im 27/92  soft-tissue]
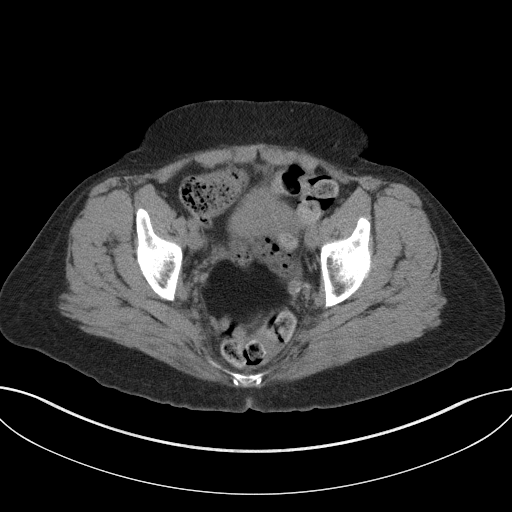
[im 35/92  soft-tissue]
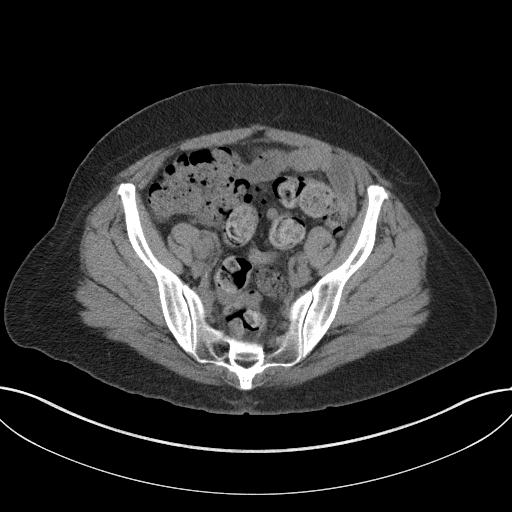
[im 42/92  soft-tissue]
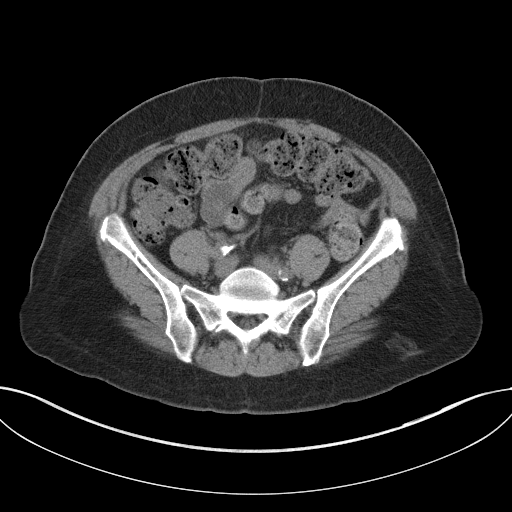
[im 50/92  soft-tissue]
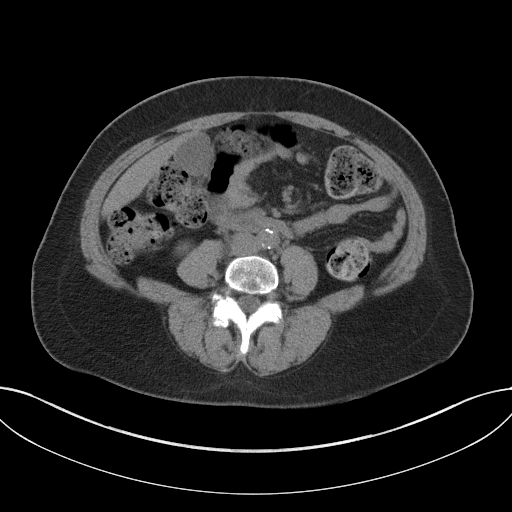
[im 57/92  soft-tissue]
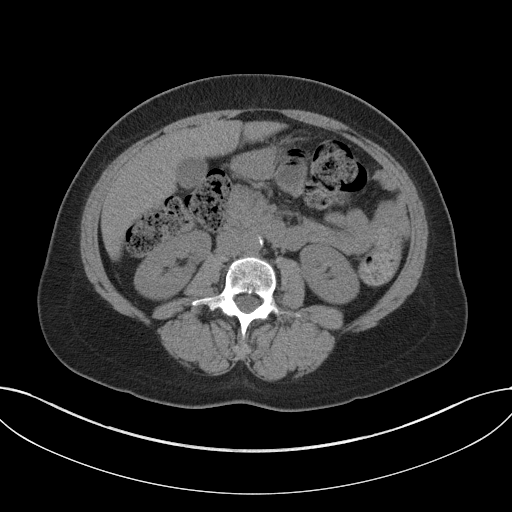
[im 65/92  soft-tissue]
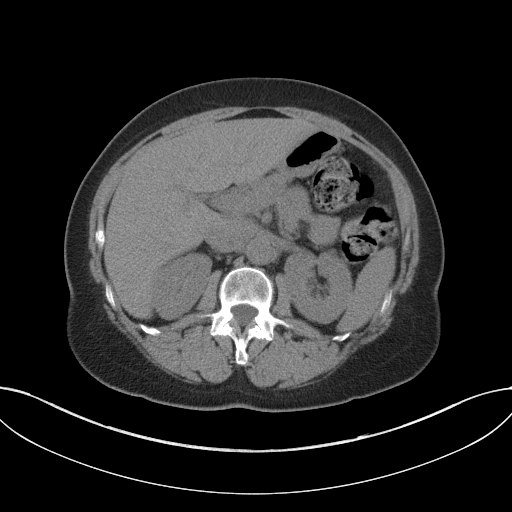
[im 65/92  bone]
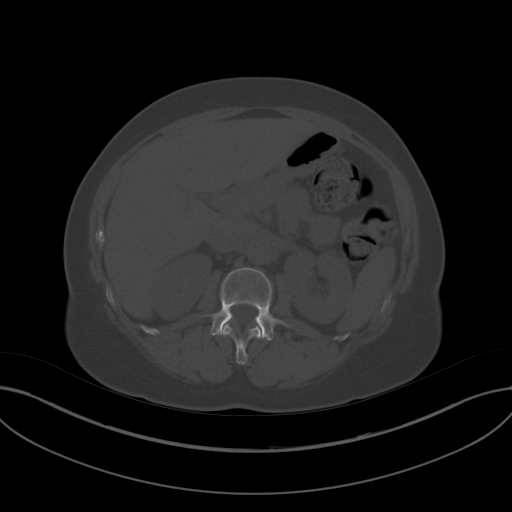
[im 73/92  soft-tissue]
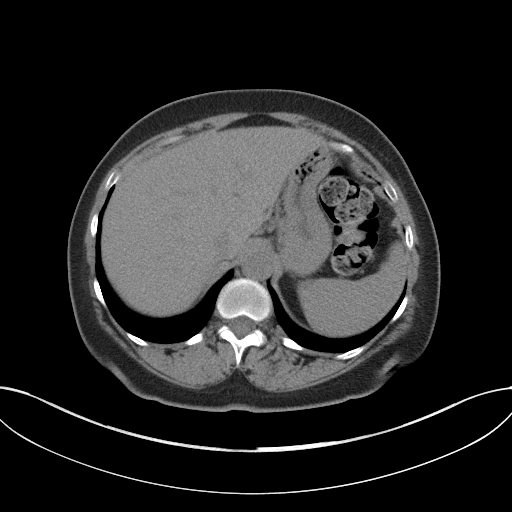
[im 80/92  soft-tissue]
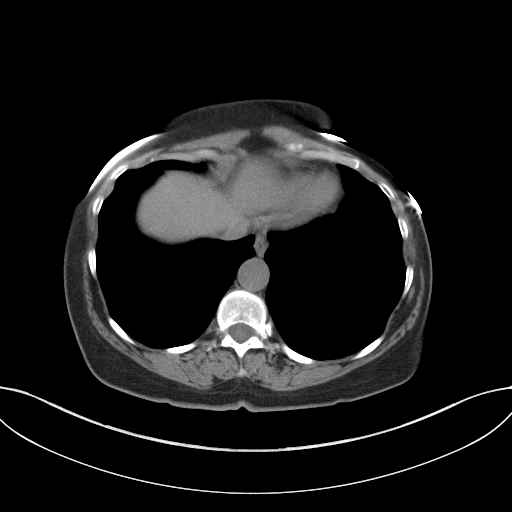
[im 88/92  soft-tissue]
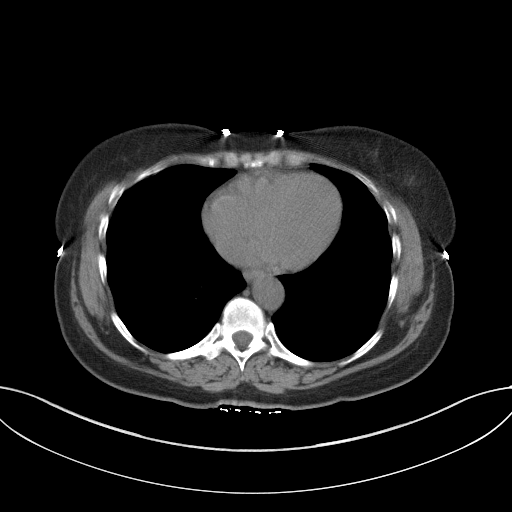

[Series 5: renal stone 3.0 coronal · coronal · 0.86mm/px · 3 of 90 slices shown]
[im 30/90  soft-tissue]
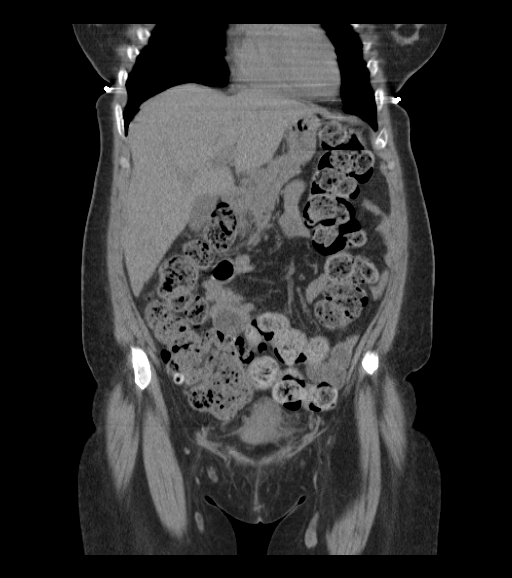
[im 40/90  soft-tissue]
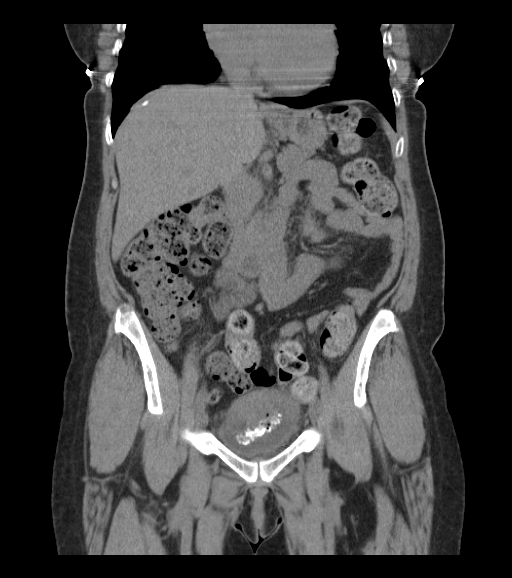
[im 50/90  soft-tissue]
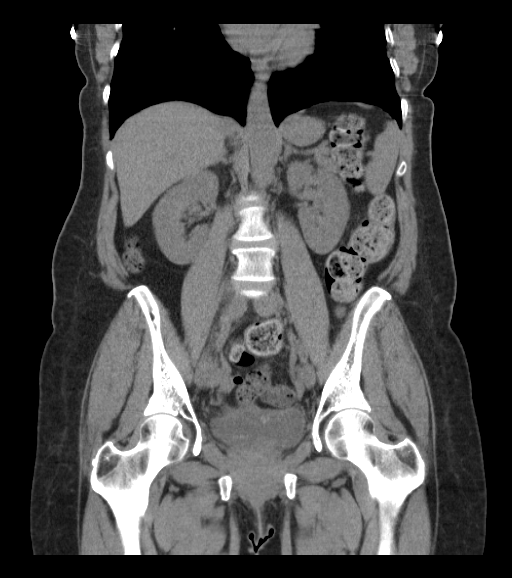

[15 of 46 positions shown; findings below may reference images not displayed]

FINDINGS: Sagittal images of the spine shows no destructive bony lesions. The
lung bases are unremarkable. Unenhanced liver, spleen, pancreas and
adrenal glands are unremarkable. No nephrolithiasis. No
hydronephrosis or hydroureter. Unenhanced kidneys are symmetrical in
size. Mild atherosclerotic calcifications of abdominal aorta and
iliac arteries. No calcified gallstones are noted within
gallbladder. Moderate stool noted throughout the colon. No small
bowel obstruction. Abundant stool noted within cecum. No pericecal
inflammation. Normal retrocecal appendix noted in axial image 63.

In axial image 47 there is irregular soft tissue nodular lesion
anterior to right psoas muscle just above the right iliac vein
measures 3 cm highly suspicious for pathologic adenopathy.

There is a right obturator pathologic lymph node axial image 61
measures 2.5 by 1.9 cm highly suspicious for pathologic metastatic
disease.

The patient is status post hysterectomy. Again noted a urinary
bladder mass in anterior superior aspect of the bladder. The mass
has progressed in size from prior exam measures at least 5.9 by
cm. There are calcifications posterior aspect of the lesion. There
is E infiltration of the wall and there is infiltration of adjacent
mesenteric fat highly suspicious for tumor extension beyond the
bladder wall. There is small amount of air trapped within bladder
inferiorly to the mass. Although this may be due to instrumentation
a fistulous tract cannot be excluded. Clinical correlation is
necessary. There is close proximity of the sigmoid colon with
bladder wall just above the lesion. Clinical correlation is
necessary to exclude invasion of colonic wall please see sagittal
image 74.

Axial image 69 there is a high-density focus posterior superior
aspect of the bladder wall measures 1 cm. Secondary tumor cannot be
excluded. There is high density foci posterior aspect of the bladder
axial image 72. Bladder stone or tumor foci cannot be excluded.
Correlation with cystoscopy is recommended. There is no colonic
obstruction. Moderate stool noted in sigmoid colon and rectum. Small
nonspecific bilateral inguinal lymph nodes are noted. A left pelvic
sidewall lymph node axial image 63 measures 1.4 cm pathologic by
size criteria.
IMPRESSION: 1. There is significant progression in size of urinary bladder tumor
in anterior and superior aspect of the urinary bladder. There is
stranding of adjacent fat highly suspicious for tumor extension
beyond bladder wall. There is small amount of air within urinary
bladder just inferior to the tumor. Although this may be due to
recent instrumentation a fistulous tract cannot be excluded. There
is close proximity of the superior bladder wall with sigmoid colon.
Invasion of the colonic wall cannot be entirely excluded on this
unenhanced scan.
2. Additional smaller foci are noted within posterior aspect of
superior bladder wall and inferior posterior bladder wall. Secondary
seeding cannot be excluded.
3. There are pathologic lymph nodes within pelvis and within right
iliac chain highly suspicious for metastatic disease.
4. Moderate stool throughout the colon. No pericecal inflammation.
Normal appendix.
5. No small bowel obstruction.
6. No hydronephrosis or hydroureter.

These results were called by telephone at the time of interpretation
on 10/15/2014 at [DATE] to Dr. JHON ARMANDO PUJIMUY , who verbally
acknowledged these results.

## 2015-10-28 ENCOUNTER — Inpatient Hospital Stay
Admission: RE | Admit: 2015-10-28 | Discharge: 2015-10-28 | Disposition: A | Discharge status: Home / Self Care | Payer: Self-pay | Source: Ambulatory Visit | Attending: Radiation Oncology | Admitting: Radiation Oncology

## 2015-10-28 DIAGNOSIS — C673 Malignant neoplasm of anterior wall of bladder: Secondary | ICD-10-CM

## 2015-10-28 DIAGNOSIS — C7951 Secondary malignant neoplasm of bone: Secondary | ICD-10-CM

## 2015-10-28 NOTE — Progress Notes (Signed)
Pt no showed for her appt.

## 2015-11-04 ENCOUNTER — Other Ambulatory Visit: Payer: Self-pay | Admitting: Internal Medicine

## 2015-11-04 DIAGNOSIS — C679 Malignant neoplasm of bladder, unspecified: Secondary | ICD-10-CM

## 2015-11-16 ENCOUNTER — Ambulatory Visit
Admission: RE | Admit: 2015-11-16 | Discharge: 2015-11-16 | Disposition: A | Discharge status: Home / Self Care | Payer: Medicaid Other | Source: Ambulatory Visit | Attending: Internal Medicine | Admitting: Internal Medicine

## 2015-11-16 DIAGNOSIS — C679 Malignant neoplasm of bladder, unspecified: Secondary | ICD-10-CM

## 2015-11-16 MED ORDER — GADOBENATE DIMEGLUMINE 529 MG/ML IV SOLN
15.0000 mL | Freq: Once | INTRAVENOUS | Status: AC | PRN
  Administered 2015-11-16: 15 mL via INTRAVENOUS

## 2015-12-01 ENCOUNTER — Emergency Department (HOSPITAL_BASED_OUTPATIENT_CLINIC_OR_DEPARTMENT_OTHER)
Admission: EM | Admit: 2015-12-01 | Discharge: 2015-12-02 | Disposition: A | Discharge status: Home / Self Care | Payer: Medicaid Other | Attending: Emergency Medicine | Admitting: Emergency Medicine

## 2015-12-01 ENCOUNTER — Encounter (HOSPITAL_BASED_OUTPATIENT_CLINIC_OR_DEPARTMENT_OTHER): Payer: Self-pay | Admitting: *Deleted

## 2015-12-01 DIAGNOSIS — I10 Essential (primary) hypertension: Secondary | ICD-10-CM | POA: Insufficient documentation

## 2015-12-01 DIAGNOSIS — M79605 Pain in left leg: Secondary | ICD-10-CM | POA: Diagnosis present

## 2015-12-01 DIAGNOSIS — Z87891 Personal history of nicotine dependence: Secondary | ICD-10-CM | POA: Diagnosis not present

## 2015-12-01 DIAGNOSIS — C799 Secondary malignant neoplasm of unspecified site: Secondary | ICD-10-CM

## 2015-12-01 DIAGNOSIS — Z79899 Other long term (current) drug therapy: Secondary | ICD-10-CM | POA: Insufficient documentation

## 2015-12-01 DIAGNOSIS — C774 Secondary and unspecified malignant neoplasm of inguinal and lower limb lymph nodes: Secondary | ICD-10-CM | POA: Diagnosis not present

## 2015-12-01 DIAGNOSIS — Z8551 Personal history of malignant neoplasm of bladder: Secondary | ICD-10-CM | POA: Insufficient documentation

## 2015-12-01 DIAGNOSIS — Z791 Long term (current) use of non-steroidal anti-inflammatories (NSAID): Secondary | ICD-10-CM | POA: Diagnosis not present

## 2015-12-01 NOTE — ED Triage Notes (Signed)
Pt c/o left thigh pain x 1 week w/o injury

## 2015-12-02 ENCOUNTER — Emergency Department (HOSPITAL_BASED_OUTPATIENT_CLINIC_OR_DEPARTMENT_OTHER): Payer: Medicaid Other

## 2015-12-02 LAB — CBC WITH DIFFERENTIAL/PLATELET
BASOS ABS: 0 10*3/uL (ref 0.0–0.1)
BASOS PCT: 0 %
Eosinophils Absolute: 0.1 10*3/uL (ref 0.0–0.7)
Eosinophils Relative: 4 %
HEMATOCRIT: 33.4 % — AB (ref 36.0–46.0)
HEMOGLOBIN: 10.6 g/dL — AB (ref 12.0–15.0)
Lymphocytes Relative: 27 %
Lymphs Abs: 0.9 10*3/uL (ref 0.7–4.0)
MCH: 25.7 pg — ABNORMAL LOW (ref 26.0–34.0)
MCHC: 31.7 g/dL (ref 30.0–36.0)
MCV: 80.9 fL (ref 78.0–100.0)
MONOS PCT: 17 %
Monocytes Absolute: 0.5 10*3/uL (ref 0.1–1.0)
NEUTROS ABS: 1.7 10*3/uL (ref 1.7–7.7)
NEUTROS PCT: 52 %
Platelets: 190 10*3/uL (ref 150–400)
RBC: 4.13 MIL/uL (ref 3.87–5.11)
RDW: 16.7 % — ABNORMAL HIGH (ref 11.5–15.5)
WBC: 3.2 10*3/uL — AB (ref 4.0–10.5)

## 2015-12-02 LAB — BASIC METABOLIC PANEL
ANION GAP: 6 (ref 5–15)
BUN: 17 mg/dL (ref 6–20)
CHLORIDE: 106 mmol/L (ref 101–111)
CO2: 29 mmol/L (ref 22–32)
Calcium: 8.9 mg/dL (ref 8.9–10.3)
Creatinine, Ser: 0.83 mg/dL (ref 0.44–1.00)
GFR calc non Af Amer: >60 mL/min (ref >60)
Glucose, Bld: 117 mg/dL — ABNORMAL HIGH (ref 65–99)
POTASSIUM: 3.6 mmol/L (ref 3.5–5.1)
SODIUM: 141 mmol/L (ref 135–145)

## 2015-12-02 LAB — D-DIMER, QUANTITATIVE (NOT AT ARMC): D DIMER QUANT: 0.34 ug{FEU}/mL (ref 0.00–0.50)

## 2015-12-02 MED ORDER — HEPARIN SOD (PORK) LOCK FLUSH 100 UNIT/ML IV SOLN
500.0000 [IU] | Freq: Once | INTRAVENOUS | Status: AC
  Administered 2015-12-02: 500 [IU]

## 2015-12-02 MED ORDER — IOPAMIDOL (ISOVUE-300) INJECTION 61%
100.0000 mL | Freq: Once | INTRAVENOUS | Status: AC | PRN
  Administered 2015-12-02: 100 mL via INTRAVENOUS

## 2015-12-02 MED ORDER — HEPARIN SOD (PORK) LOCK FLUSH 100 UNIT/ML IV SOLN
INTRAVENOUS | Status: AC
  Administered 2015-12-02: 500 [IU]
  Filled 2015-12-02: qty 5

## 2015-12-02 NOTE — ED Provider Notes (Signed)
[facility] Provider Note   CSN: [id number] Arrival date & time: 12/01/15  2335     History   Chief Complaint Chief Complaint  Patient presents with  . Leg Pain    HPI Ruth Maldonado is a 60 y.o. female bladder cancer, with a history of metastases ,currently undergoing immunotherapy. She is here with a painful mass in her left medial thigh that has been present (or evident) for about a week. There is moderate associated pain, severe enough to make her limp. She has been taking ibuprofen and Tylenol with partial relief. She states she has narcotics at home but does not like to take them. There is no associated erythema or warmth.   HPI  Past Medical History:  Diagnosis Date  . Bladder spasm   . Bladder tumor   . Blood transfusion without reported diagnosis   . Gross hematuria   . History of uterine fibroid   . Hypertension   . S/P radiation therapy 04/29/15-05/20/15   bladder  . Wears contact lenses     Patient Active Problem List   Diagnosis Date Noted  . Bone metastasis ([place]) 04/16/2015  . Hypokalemia 04/04/2015  . Gross hematuria 04/04/2015  . Anemia 04/04/2015  . Severe anemia 04/03/2015  . Back pain 10/30/2014  . Left shoulder pain 09/24/2014  . Hyperglycemia 09/24/2014  . Hepatitis C 09/03/2013  . Routine general medical examination at a health care facility 09/01/2013  . HTN (hypertension) 08/04/2013  . Bladder cancer ([place]) 07/02/2013  . LBP (low back pain) 10/23/2012    Past Surgical History:  Procedure Laterality Date  . ABDOMINAL HYSTERECTOMY  2000  . COLPOSCOPY    . CYSTOSCOPY W/ RETROGRADES N/A 06/13/2013   Procedure: CYSTOSCOPY WITH RETROGRADE PYELOGRAM;  Surgeon: Ardis Hughs, MD;  Location: [facility];  Service: Urology;  Laterality: N/A;  . TRANSURETHRAL RESECTION OF BLADDER TUMOR WITH MITOMYCIN-C N/A 06/13/2013   Procedure: CYSTOSCOPY TRANSURETHERAL RESECTION BLADDER TUMOR BILATERAL  RETROGRADE PYLEOGRAM  INSULATION OF MITOMYCON C;  Surgeon: Ardis Hughs, MD;  Location: [facility];  Service: Urology;  Laterality: N/A;    OB History    No data available       Home Medications    Prior to Admission medications   Medication Sig Start Date End Date Taking? Authorizing Provider  acetaminophen (TYLENOL) 325 MG tablet Take 650 mg by mouth every 6 (six) hours as needed.   Yes Historical Provider, MD  ibuprofen (ADVIL,MOTRIN) 200 MG tablet Take 200 mg by mouth every 6 (six) hours as needed for mild pain or moderate pain.   Yes Historical Provider, MD  Cholecalciferol (VITAMIN D3) 1000 UNITS CAPS Take 1 capsule by mouth daily.    Historical Provider, MD  Docusate Sodium (STOOL SOFTENER) 100 MG capsule Take 100 mg by mouth 2 (two) times daily.    Historical Provider, MD  hydrochlorothiazide (HYDRODIURIL) 25 MG tablet TAKE ONE TABLET BY MOUTH ONCE DAILY 08/04/14   Debbrah Alar, NP  LORazepam (ATIVAN) 0.5 MG tablet Take 0.5 mg by mouth every 6 (six) hours as needed for anxiety. Reported on 09/06/2015    Historical Provider, MD  losartan (COZAAR) 50 MG tablet TAKE ONE TABLET BY MOUTH ONCE DAILY 08/04/14   Debbrah Alar, NP  milk thistle 175 MG tablet Take 175 mg by mouth daily as needed. Reported on 04/30/2015    Historical Provider, MD  [place].  1 dropper per day.    Historical Provider,  MD  OVER THE COUNTER MEDICATION Apply topically. LEMON GRASS ESSENTIAL OILS.  Once a day.    Historical Provider, MD  OVER THE COUNTER MEDICATION FRANKINCENSE.  2 drops under tongue three times daily.    Historical Provider, MD  OVER THE COUNTER MEDICATION JUNIPER BERRY AND GRAPEFRUIT.  Mix together take in 1 veggie capsule daily.    Historical Provider, MD  OVER THE COUNTER MEDICATION LAVENDER AND YLANG YLANG. Apply to bottom of feet once a day.    Historical Provider, MD  oxyCODONE (OXYCONTIN) 20 mg 12 hr tablet Take 20 mg by mouth every 12 (twelve) hours. 08/31/15  08/30/16  Historical Provider, MD  Oxycodone HCl 10 MG TABS Take 1 tablet (10 mg total) by mouth every 4 (four) hours as needed. 04/30/15   Kyung Rudd, MD  polyethylene glycol [facility] / GLYCOLAX) packet Take 17 g by mouth daily as needed. Reported on 04/30/2015    Historical Provider, MD  [place] [facility].  Oncologist Tessie Eke.    Historical Provider, MD  sennosides-docusate sodium (SENOKOT-S) 8.6-50 MG tablet Take 1 tablet by mouth 2 (two) times daily.    Historical Provider, MD  vitamin E 400 UNIT capsule Take 400 Units by mouth daily. Reported on 07/21/2015    Historical Provider, MD    Family History Family History  Problem Relation Age of Onset  . Epilepsy Mother     temporal lobe epilepsy  . Emphysema Mother   . Cancer Mother     lung  . Hypertension Mother   . Heart attack Father     Social History Social History  Substance Use Topics  . Smoking status: Former Smoker    Packs/day: 1.00    Years: 24.00    Types: Cigarettes    Quit date: 06/11/1998  . Smokeless tobacco: Never Used  . Alcohol use No     Allergies   Adhesive [tape]   Review of Systems Review of Systems  All other systems reviewed and are negative.   Physical Exam Updated Vital Signs BP 131/76 (BP Location: Right Arm)   Pulse 68   Temp 98 F (36.7 C) (Oral)   Resp 18   Ht 5' 5.5" (1.664 m)   Wt 160 lb (72.6 kg)   SpO2 99%   BMI 26.22 kg/m   Physical Exam General: Well-developed, well-nourished female in no acute distress; appearance consistent with age of record HENT: normocephalic; atraumatic Eyes: pupils equal, round and reactive to light; extraocular muscles intact Neck: supple Heart: regular rate and rhythm Lungs: clear to auscultation bilaterally Chest: Port-A-Cath right upper chest Abdomen: soft; nondistended; nontender; no masses or hepatosplenomegaly; bowel sounds present Extremities: No deformity; full range of motion; pulses normal; tender mass left medial  mid thigh Neurologic: Awake, alert and oriented; motor function intact in all extremities and symmetric; no facial droop Skin: Warm and dry Psychiatric: Normal mood and affect    ED Treatments / Results  Nursing notes and vitals signs, including pulse oximetry, reviewed.  Summary of this visit's results, reviewed by myself:  Labs:  Results for orders placed or performed during the hospital encounter of 12/01/15 (from the past 24 hour(s))  CBC with Differential/Platelet     Status: Abnormal   Collection Time: 12/02/15  1:20 AM  Result Value Ref Range   WBC 3.2 (L) 4.0 - 10.5 K/uL   RBC 4.13 3.87 - 5.11 MIL/uL   Hemoglobin 10.6 (L) 12.0 - 15.0 g/dL   HCT 33.4 (L) 36.0 -  46.0 %   MCV 80.9 78.0 - 100.0 fL   MCH 25.7 (L) 26.0 - 34.0 pg   MCHC 31.7 30.0 - 36.0 g/dL   RDW 16.7 (H) 11.5 - 15.5 %   Platelets 190 150 - 400 K/uL   Neutrophils Relative % 52 %   Neutro Abs 1.7 1.7 - 7.7 K/uL   Lymphocytes Relative 27 %   Lymphs Abs 0.9 0.7 - 4.0 K/uL   Monocytes Relative 17 %   Monocytes Absolute 0.5 0.1 - 1.0 K/uL   Eosinophils Relative 4 %   Eosinophils Absolute 0.1 0.0 - 0.7 K/uL   Basophils Relative 0 %   Basophils Absolute 0.0 0.0 - 0.1 K/uL  Basic metabolic panel     Status: Abnormal   Collection Time: 12/02/15  1:20 AM  Result Value Ref Range   Sodium 141 135 - 145 mmol/L   Potassium 3.6 3.5 - 5.1 mmol/L   Chloride 106 101 - 111 mmol/L   CO2 29 22 - 32 mmol/L   Glucose, Bld 117 (H) 65 - 99 mg/dL   BUN 17 6 - 20 mg/dL   Creatinine, Ser 0.83 0.44 - 1.00 mg/dL   Calcium 8.9 8.9 - 10.3 mg/dL   GFR calc non Af Amer >60 >60 mL/min   GFR calc Af Amer >60 >60 mL/min   Anion gap 6 5 - 15  D-dimer, quantitative (not at [facility])     Status: None   Collection Time: 12/02/15  1:20 AM  Result Value Ref Range   D-Dimer, Quant 0.34 0.00 - 0.50 ug/mL-FEU    Imaging Studies: Ct Pelvis W Contrast  Result Date: 12/02/2015 CLINICAL DATA:  Palpable mass in the medial upper left thigh,  painful. EXAM: CT PELVIS WITH CONTRAST TECHNIQUE: Multidetector CT imaging of the pelvis was performed using the standard protocol following the bolus administration of intravenous contrast. CONTRAST:  12mL ISOVUE-300 IOPAMIDOL (ISOVUE-300) INJECTION 61% COMPARISON:  None. FINDINGS: There is an intramuscular 3.8 x 4.5 cm mass within the left adductor magnus, mildly hyper attenuating and this likely is due to contrast enhancement. The mass is quite suspicious for a metastatic lesion. No other intramuscular masses are evident. There is mixed lytic and sclerotic lesion of the right iliac bone anteriorly, image 8 series 3. There also is a similar mixed lytic and sclerotic lesion of the left lesser trochanter, image 39 series 3. These skeletal lesions are new from 10/15/2014 and suspicious for metastases. No mass or adenopathy is evident within the pelvis.  No ascites. IMPRESSION: 1. The palpable left medial thigh abnormality corresponds to and intramuscular mass of the left adductor magnus, suspicious for a metastatic lesion. 2. There also are 2 new skeletal lesions, suspicious for hematogenous skeletal metastases. Electronically Signed   By: Andreas Newport M.D.   On: 12/02/2015 04:15   4:28 AM The patient was advised of CT findings suggesting new metastases. She will contact her oncologist later today to arrange follow-up.  Procedures (including critical care time)   Final Clinical Impressions(s) / ED Diagnoses   Final diagnoses:  Metastatic cancer [facility])      Shanon Rosser, MD 12/02/15 0430

## 2015-12-08 ENCOUNTER — Encounter: Payer: Self-pay | Admitting: *Deleted

## 2015-12-13 NOTE — Progress Notes (Signed)
RECONSULT  Metastatic Urothelial Cancer   MRI 12/02/15:  IMPRESSION: 1. The palpable left medial thigh abnormality corresponds to and intramuscular mass of the left adductor magnus, suspicious for a metastatic lesion. 2. There also are 2 new skeletal lesions, suspicious for hematogenous skeletal metastases.  History Radiation 6/8/217-09/23/2015 Right pelvis to 30GY/10 fractions

## 2015-12-15 ENCOUNTER — Ambulatory Visit
Admission: RE | Admit: 2015-12-15 | Discharge: 2015-12-15 | Disposition: A | Discharge status: Home / Self Care | Payer: Medicaid Other | Source: Ambulatory Visit | Attending: Radiation Oncology | Admitting: Radiation Oncology

## 2015-12-15 ENCOUNTER — Encounter: Payer: Self-pay | Admitting: Radiation Oncology

## 2015-12-15 ENCOUNTER — Ambulatory Visit: Admission: RE | Admit: 2015-12-15 | Payer: Medicaid Other | Source: Ambulatory Visit

## 2015-12-15 ENCOUNTER — Other Ambulatory Visit: Payer: Self-pay | Admitting: Radiation Oncology

## 2015-12-15 ENCOUNTER — Telehealth: Payer: Self-pay | Admitting: *Deleted

## 2015-12-15 VITALS — BP 143/97 | HR 69 | Temp 98.0°F | Resp 18 | Ht 65.5 in | Wt 165.0 lb

## 2015-12-15 DIAGNOSIS — C679 Malignant neoplasm of bladder, unspecified: Secondary | ICD-10-CM

## 2015-12-15 DIAGNOSIS — Z510 Encounter for antineoplastic radiation therapy: Secondary | ICD-10-CM | POA: Insufficient documentation

## 2015-12-15 DIAGNOSIS — Z8249 Family history of ischemic heart disease and other diseases of the circulatory system: Secondary | ICD-10-CM | POA: Diagnosis not present

## 2015-12-15 DIAGNOSIS — C673 Malignant neoplasm of anterior wall of bladder: Secondary | ICD-10-CM

## 2015-12-15 DIAGNOSIS — G893 Neoplasm related pain (acute) (chronic): Secondary | ICD-10-CM | POA: Insufficient documentation

## 2015-12-15 DIAGNOSIS — M545 Low back pain, unspecified: Secondary | ICD-10-CM

## 2015-12-15 DIAGNOSIS — Z923 Personal history of irradiation: Secondary | ICD-10-CM | POA: Diagnosis not present

## 2015-12-15 DIAGNOSIS — Z801 Family history of malignant neoplasm of trachea, bronchus and lung: Secondary | ICD-10-CM | POA: Diagnosis not present

## 2015-12-15 DIAGNOSIS — Z87891 Personal history of nicotine dependence: Secondary | ICD-10-CM | POA: Insufficient documentation

## 2015-12-15 DIAGNOSIS — Z836 Family history of other diseases of the respiratory system: Secondary | ICD-10-CM | POA: Insufficient documentation

## 2015-12-15 DIAGNOSIS — Z9109 Other allergy status, other than to drugs and biological substances: Secondary | ICD-10-CM | POA: Diagnosis not present

## 2015-12-15 DIAGNOSIS — I10 Essential (primary) hypertension: Secondary | ICD-10-CM | POA: Insufficient documentation

## 2015-12-15 DIAGNOSIS — C7951 Secondary malignant neoplasm of bone: Secondary | ICD-10-CM

## 2015-12-15 DIAGNOSIS — N3289 Other specified disorders of bladder: Secondary | ICD-10-CM | POA: Insufficient documentation

## 2015-12-15 DIAGNOSIS — Z90710 Acquired absence of both cervix and uterus: Secondary | ICD-10-CM | POA: Diagnosis not present

## 2015-12-15 DIAGNOSIS — R590 Localized enlarged lymph nodes: Secondary | ICD-10-CM | POA: Diagnosis not present

## 2015-12-15 HISTORY — DX: Personal history of vaginal dysplasia: Z87.411

## 2015-12-15 NOTE — Progress Notes (Signed)
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 A diagnosis of Bone metastasis ([place]) was also pertinent to this visit.   HISTORY OF PRESENT ILLNESS: Ruth Maldonado is a 60 y.o. female seen at the request of Dr. Kalman Maldonado for consideration of radiotherapy to a newly noted left medial thigh lesion given her history of metastatic bladder cancer. The patient's history has been well outlined and she has received radiotherapy in our clinic previously. She is treated systemically with Dr. Kalman Maldonado at [facility] and is on [place]. She will be considering a retrial of cisplatin and gemcitabine. Since her last visit with Ruth Maldonado, the patient has developed left thigh and back pain. She also continues to have some hip pain bilaterally. She was seen in the ED on 12/02/15 and was found to have a mass in the left adductor magnus which measures 3.8 x 4.5 cm.  Of note she also has retroperitoneal adenopathy which measured about 1.7 x 1.1 cm in July 2017, and was previously 1.5 x 1.1 in May 2017.   PREVIOUS RADIATION THERAPY: Yes   09/09/15-09/23/15: 30 Gy in 10 fractions to the right hip  04/29/2015-05/20/2015: Bladder and left femur to a dose of 37.5 Gy in 15 fractions. The patient received concurrent chemotherapy.   PAST MEDICAL HISTORY:  Past Medical History:  Diagnosis Date  . Bladder spasm   . Bladder tumor   . Blood transfusion without reported diagnosis   . Gross hematuria   . History of uterine fibroid   . Hypertension   . S/P radiation therapy 04/29/15-05/20/15   bladder  . Wears contact lenses        PAST SURGICAL HISTORY: Past Surgical History:  Procedure Laterality Date  . ABDOMINAL HYSTERECTOMY  2000  .  COLPOSCOPY    . CYSTOSCOPY W/ RETROGRADES N/A 06/13/2013   Procedure: CYSTOSCOPY WITH RETROGRADE PYELOGRAM;  Surgeon: Ruth Hughs, MD;  Location: [facility];  Service: Urology;  Laterality: N/A;  . TRANSURETHRAL RESECTION OF BLADDER TUMOR WITH MITOMYCIN-C N/A 06/13/2013   Procedure: CYSTOSCOPY TRANSURETHERAL RESECTION BLADDER TUMOR BILATERAL  RETROGRADE PYLEOGRAM INSULATION OF MITOMYCON C;  Surgeon: Ruth Hughs, MD;  Location: [facility];  Service: Urology;  Laterality: N/A;     FAMILY HISTORY:  Family History  Problem Relation Age of Onset  . Epilepsy Mother     temporal lobe epilepsy  . Emphysema Mother   . Cancer Mother     lung  . Hypertension Mother   . Heart attack Father      SOCIAL HISTORY:  reports that she quit smoking about 17 years ago. Her smoking use included Cigarettes. She has a 24.00 pack-year smoking history. She has never used smokeless tobacco. She reports that she does not drink alcohol or use drugs. The patient is married and resides in [place] and is married.   ALLERGIES: Adhesive [tape]   MEDICATIONS:  Current Outpatient Prescriptions  Medication Sig Dispense Refill  . acetaminophen (TYLENOL) 325 MG tablet Take 650 mg by mouth every 6 (six) hours as needed.    . Cholecalciferol (VITAMIN D3) 1000 UNITS CAPS Take 1 capsule by mouth daily.    Ruth Maldonado Sodium (STOOL SOFTENER) 100  MG capsule Take 100 mg by mouth 2 (two) times daily.    . hydrochlorothiazide (HYDRODIURIL) 25 MG tablet TAKE ONE TABLET BY MOUTH ONCE DAILY 30 tablet 5  . ibuprofen (ADVIL,MOTRIN) 200 MG tablet Take 200 mg by mouth every 6 (six) hours as needed for mild pain or moderate pain.    Ruth Maldonado losartan (COZAAR) 50 MG tablet TAKE ONE TABLET BY MOUTH ONCE DAILY 30 tablet 5  . milk thistle 175 MG tablet Take 175 mg by mouth daily as needed. Reported on 04/30/2015    . OVER THE COUNTER MEDICATION TUMERIC.  1 dropper per day.    Ruth Maldonado OVER THE COUNTER  MEDICATION Apply topically. LEMON GRASS ESSENTIAL OILS.  Once a day.    Ruth Maldonado OVER THE COUNTER MEDICATION FRANKINCENSE.  2 drops under tongue three times daily.    Ruth Maldonado OVER THE COUNTER MEDICATION JUNIPER BERRY AND GRAPEFRUIT.  Mix together take in 1 veggie capsule daily.    Ruth Maldonado OVER THE COUNTER MEDICATION LAVENDER AND YLANG YLANG. Apply to bottom of feet once a day.    Ruth Maldonado PRESCRIPTION MEDICATION Chemo at [facility].  Oncologist Ruth Maldonado.    . vitamin E 400 UNIT capsule Take 400 Units by mouth daily. Reported on 07/21/2015    . LORazepam (ATIVAN) 0.5 MG tablet Take 0.5 mg by mouth every 6 (six) hours as needed for anxiety. Reported on 09/06/2015    . oxyCODONE (OXYCONTIN) 20 mg 12 hr tablet Take 20 mg by mouth every 12 (twelve) hours.    . Oxycodone HCl 10 MG TABS Take 1 tablet (10 mg total) by mouth every 4 (four) hours as needed. (Patient not taking: Reported on 12/15/2015) 40 tablet 0  . polyethylene glycol (MIRALAX / GLYCOLAX) packet Take 17 g by mouth daily as needed. Reported on 04/30/2015    . sennosides-docusate sodium (SENOKOT-S) 8.6-50 MG tablet Take 1 tablet by mouth 2 (two) times daily.     No current facility-administered medications for this encounter.      REVIEW OF SYSTEMS: On review of systems, the patient reports that she is doing well overall. She continues to use ibuprofen and tylenol for pain management. She describes persistent pain in her hips bilaterally, but reports this is better since completing radiation. She describes pain in her left medial thigh and low back that are new in the last month or so. Her back pain is not worsened by movement. She denies any numbness or tingling in her extremities, loss of bowel or bladder function, chest pain, shortness of breath, cough, fevers, chills, night sweats, unintended weight changes. She denies any bowel disturbances, and reports she occasionally has urgency to urinate. She denies abdominal pain, nausea or vomiting. A complete review of systems is  obtained and is otherwise negative.     PHYSICAL EXAM:  height is 5' 5.5" (1.664 m) and weight is 165 lb (74.8 kg). Her oral temperature is 98 F (36.7 C). Her blood pressure is 143/97 (abnormal) and her pulse is 69. Her respiration is 18 and oxygen saturation is 100%.   Pain scale 0/10 In general this is a well appearing African American female in no acute distress. She's alert and oriented x4 and appropriate throughout the examination. Cardiopulmonary assessment is negative for acute distress and she exhibits normal effort. The left thigh is assessed and an area about 3cm is noted along the medial aspect of the thigh without edema. No skin breakdown is noted. There is palpable pain along the lumbar spine at the base  of the L5/sacral region bilaterally.   ECOG = 1  0 - Asymptomatic (Fully active, able to carry on all predisease activities without restriction)  1 - Symptomatic but completely ambulatory (Restricted in physically strenuous activity but ambulatory and able to carry out work of a light or sedentary nature. For example, light housework, office work)  2 - Symptomatic, <50% in bed during the day (Ambulatory and capable of all self care but unable to carry out any work activities. Up and about more than 50% of waking hours)  3 - Symptomatic, >50% in bed, but not bedbound (Capable of only limited self-care, confined to bed or chair 50% or more of waking hours)  4 - Bedbound (Completely disabled. Cannot carry on any self-care. Totally confined to bed or chair)  5 - Death   Eustace Pen MM, Creech RH, Tormey DC, et al. [phone]). "Toxicity and response criteria of the [facility]". [place] Oncol. 5 (6): 649-55    LABORATORY DATA:  Lab Results  Component Value Date   WBC 3.2 (L) 12/02/2015   HGB 10.6 (L) 12/02/2015   HCT 33.4 (L) 12/02/2015   MCV 80.9 12/02/2015   PLT 190 12/02/2015   Lab Results  Component Value Date   NA 141 12/02/2015   K 3.6 12/02/2015    CL 106 12/02/2015   CO2 29 12/02/2015   Lab Results  Component Value Date   ALT 16 04/04/2015   AST 24 04/04/2015   ALKPHOS 82 04/04/2015   BILITOT 1.2 04/04/2015      RADIOGRAPHY: Ct Pelvis W Contrast  Result Date: 12/02/2015 CLINICAL DATA:  Palpable mass in the medial upper left thigh, painful. EXAM: CT PELVIS WITH CONTRAST TECHNIQUE: Multidetector CT imaging of the pelvis was performed using the standard protocol following the bolus administration of intravenous contrast. CONTRAST:  157mL ISOVUE-300 IOPAMIDOL (ISOVUE-300) INJECTION 61% COMPARISON:  None. FINDINGS: There is an intramuscular 3.8 x 4.5 cm mass within the left adductor magnus, mildly hyper attenuating and this likely is due to contrast enhancement. The mass is quite suspicious for a metastatic lesion. No other intramuscular masses are evident. There is mixed lytic and sclerotic lesion of the right iliac bone anteriorly, image 8 series 3. There also is a similar mixed lytic and sclerotic lesion of the left lesser trochanter, image 39 series 3. These skeletal lesions are new from 10/15/2014 and suspicious for metastases. No mass or adenopathy is evident within the pelvis.  No ascites. IMPRESSION: 1. The palpable left medial thigh abnormality corresponds to and intramuscular mass of the left adductor magnus, suspicious for a metastatic lesion. 2. There also are 2 new skeletal lesions, suspicious for hematogenous skeletal metastases. Electronically Signed   By: Andreas Newport M.D.   On: 12/02/2015 04:15   Mr Thoracic Spine W Wo Contrast  Result Date: 11/16/2015 CLINICAL DATA:  Metastatic bladder cancer.  Pain in back. EXAM: MRI THORACIC SPINE WITHOUT AND WITH CONTRAST TECHNIQUE: Multiplanar and multiecho pulse sequences of the thoracic spine were obtained without and with intravenous contrast. CONTRAST:  22mL MULTIHANCE GADOBENATE DIMEGLUMINE 529 MG/ML IV SOLN COMPARISON:  Chest radiograph of 06/01/2008 ; abdomen CT from  10/15/2014. FINDINGS: Scout images demonstrate chronic left maxillary sinusitis as well as evidence of spondylosis and degenerative disc disease in the cervical spine which could be further investigated at that dedicated cervical spine imaging if clinically warranted. Alignment:  No vertebral subluxation is observed. Vertebrae: No findings of metastatic disease to the thoracic spine. No significant abnormal  vertebral enhancement. Cord: No significant abnormal spinal cord signal is observed. No abnormal enhancement. Paraspinal and other soft tissues: Unremarkable Disc levels: T1-2:  Unremarkable. T2-3:  Unremarkable. T3-4:  No impingement.  Minimal disc bulge. T4-5:  No impingement.  Mild disc bulge. T5-6:  No impingement.  Mild disc bulge. T6-7:  No impingement.  Mild disc bulge. T7- 8:  No impingement.  Mild disc bulge. T8- 9:  Unremarkable. T9-10:  Unremarkable. T10-11: Borderline left foraminal stenosis due to a left inferior foraminal disc protrusion, image 9/9. T11-12: Unremarkable. T12-L1: Questionable mild left foraminal stenosis due to left inferior foraminal disc protrusion. This is at the extreme margin of imaging which can distort findings, an mass has a reduced positive predictive value. IMPRESSION: 1. Borderline left foraminal stenosis at T10-11 due to disc protrusion. Questionable mild left foraminal stenosis at T12-L1 due to a left inferior disc protrusion. 2. Disc bulges at multiple thoracic levels, without impingement at those levels. 3. No evidence metastatic disease to the thoracic spine. Electronically Signed   By: Van Clines M.D.   On: 11/16/2015 15:12       IMPRESSION:  1. 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 Dr. Lisbeth Renshaw discusses the findings of the right iliac region and left trocanter appear to be doing well and the  lytic components of these bones has resolved and become more sclerotic, indicating response from radiation. He would not recommend any treatment to these areas, but would recommend proceeding with an MRI of the lumbar spine given her low back pain. In addition, he would recommend proceeding with palliative treatment in 10 fractions to the left medial thigh. We discussed the risks, benefits, short, and long term effects of therapy. She is interested in moving forward and written consent is obtained, and simulation is scheduled for 12/20/15. We also discussed that I would contact Dr. Kalman Maldonado to determine if she would like Ruth Maldonado to consider treating the patient's retroperitoneal adenopathy. She states agreement and understanding. 2. Pain due to metastatic cancer. I have discussed involving palliative care, however the patient is not interested in this at this time. She will continue OTC analgesics for relief.  The above documentation reflects my direct findings during this shared patient visit. Please see the separate note by Dr. Lisbeth Renshaw on this date for the remainder of the patient's plan of care.    Carola Rhine, PAC

## 2015-12-15 NOTE — Addendum Note (Signed)
Encounter addended by: Malena Edman, RN on: 12/15/2015  3:59 PM<BR>    Actions taken: Charge Capture section accepted

## 2015-12-15 NOTE — Addendum Note (Signed)
Encounter addended by: Hayden Pedro, PA-C on: 12/15/2015  1:01 PM<BR>    Actions taken: Visit diagnoses modified, Pend clinical note

## 2015-12-15 NOTE — Addendum Note (Signed)
Encounter addended by: Hayden Pedro, PA-C on: 12/15/2015  3:21 PM<BR>    Actions taken: Sign clinical note, LOS modified, Visit diagnoses modified, Follow-up modified

## 2015-12-15 NOTE — Progress Notes (Signed)
RECONSULT  Metastatic Urothelial Cancer   MRI 12/02/15:  IMPRESSION: 1. The palpable left medial thigh abnormality corresponds to and intramuscular mass of the left adductor magnus, suspicious for a metastatic lesion. 2. There also are 2 new skeletal lesions, suspicious for hematogenous skeletal metastases.  History Radiation 6/8/217-09/23/2015 Right pelvis to 30GY/10 fractions  Pain:no Appetite:Good Fatigue:Having fatigue early afternoon, usually takes a nap. Wt Readings from Last 3 Encounters:  12/15/15 165 lb (74.8 kg)  12/01/15 160 lb (72.6 kg)  09/17/15 157 lb 3.2 oz (71.3 kg)  BP (!) 143/97 (BP Location: Right Arm, Patient Position: Sitting, Cuff Size: Normal)   Pulse 69   Temp 98 F (36.7 C) (Oral)   Resp 18   Ht 5' 5.5" (1.664 m)   Wt 165 lb (74.8 kg)   SpO2 100%   BMI 27.04 kg/m

## 2015-12-16 ENCOUNTER — Other Ambulatory Visit: Payer: Self-pay | Admitting: Radiation Oncology

## 2015-12-16 DIAGNOSIS — C673 Malignant neoplasm of anterior wall of bladder: Secondary | ICD-10-CM

## 2015-12-16 DIAGNOSIS — C7951 Secondary malignant neoplasm of bone: Secondary | ICD-10-CM

## 2015-12-16 NOTE — Telephone Encounter (Signed)
error 

## 2015-12-17 ENCOUNTER — Ambulatory Visit (HOSPITAL_COMMUNITY): Payer: Medicaid Other

## 2015-12-20 ENCOUNTER — Ambulatory Visit: Payer: Medicaid Other | Admitting: Radiation Oncology

## 2015-12-20 ENCOUNTER — Ambulatory Visit
Admission: RE | Admit: 2015-12-20 | Discharge: 2015-12-20 | Disposition: A | Discharge status: Home / Self Care | Payer: Medicaid Other | Source: Ambulatory Visit | Attending: Radiation Oncology | Admitting: Radiation Oncology

## 2015-12-20 DIAGNOSIS — Z510 Encounter for antineoplastic radiation therapy: Secondary | ICD-10-CM | POA: Diagnosis not present

## 2015-12-20 DIAGNOSIS — C7951 Secondary malignant neoplasm of bone: Secondary | ICD-10-CM

## 2015-12-20 NOTE — Progress Notes (Signed)
  Radiation Oncology         (336) [phone] ________________________________  Name: Ruth Maldonado MRN: [id number]  Date: 12/20/2015  DOB: 02-02-1956  SIMULATION AND TREATMENT PLANNING NOTE    ICD-9-CM ICD-10-CM   1. Bone metastasis (HCC) 198.5 C79.51     DIAGNOSIS:  60 yo woman with a left thigh intramuscular metastasis from urothelial carcinoma of the bladder - Stage IV    NARRATIVE:  The patient was brought to the [facility].  Identity was confirmed.  All relevant records and images related to the planned course of therapy were reviewed.  The patient freely provided informed written consent to proceed with treatment after reviewing the details related to the planned course of therapy. The consent form was witnessed and verified by the simulation staff.  Then, the patient was set-up in a stable reproducible  supine position for radiation therapy.  CT images were obtained.  Surface markings were placed.  The CT images were loaded into the planning software.  Then the target and avoidance structures were contoured.  Treatment planning then occurred.  The radiation prescription was entered and confirmed.  Then, I designed and supervised the construction of a total of a [employer] medically necessary complex treatment device.  I have requested : Isodose Plan.    PLAN:  The patient will receive 30 Gy in 10 fractions.  ________________________________  Sheral Apley Tammi Klippel, M.D.

## 2015-12-21 ENCOUNTER — Telehealth: Payer: Self-pay | Admitting: Radiation Oncology

## 2015-12-21 ENCOUNTER — Ambulatory Visit
Admission: RE | Admit: 2015-12-21 | Discharge: 2015-12-21 | Disposition: A | Discharge status: Home / Self Care | Payer: Medicaid Other | Source: Ambulatory Visit | Attending: Radiation Oncology | Admitting: Radiation Oncology

## 2015-12-21 DIAGNOSIS — C679 Malignant neoplasm of bladder, unspecified: Secondary | ICD-10-CM

## 2015-12-21 MED ORDER — GADOBENATE DIMEGLUMINE 529 MG/ML IV SOLN
15.0000 mL | Freq: Once | INTRAVENOUS | Status: AC | PRN
  Administered 2015-12-21: 15 mL via INTRAVENOUS

## 2015-12-21 NOTE — Telephone Encounter (Signed)
I called the patient to let her know her MRI did not reveal disease in the lumbar spine. Dr. Lisbeth Renshaw will look at the retroperitoneal nodes that are seen at 3cm and we will discuss next week if he feels they need to be included in her treatment.

## 2015-12-23 DIAGNOSIS — Z510 Encounter for antineoplastic radiation therapy: Secondary | ICD-10-CM | POA: Diagnosis not present

## 2015-12-25 NOTE — Progress Notes (Signed)
  Radiation Oncology         (336) [phone] ________________________________  Name: Asjah Grills MRN: [id number]  Date: 12/20/2015  DOB: [birth year]  SIMULATION AND TREATMENT PLANNING NOTE  DIAGNOSIS:     ICD-9-CM ICD-10-CM   1. Bone metastasis ([place]) 198.5 C79.51      Site:   Left medial thigh  NARRATIVE:  The patient was brought to the [facility].  Identity was confirmed.  All relevant records and images related to the planned course of therapy were reviewed.   Written consent to proceed with treatment was confirmed which was freely given after reviewing the details related to the planned course of therapy had been reviewed with the patient.  Then, the patient was set-up in a stable reproducible  supine position for radiation therapy.  CT images were obtained.  Surface markings were placed.    Medically necessary complex treatment device(s) for immobilization:   Customized vac lock bag.   The CT images were loaded into the planning software.  Then the target and avoidance structures were contoured.  Treatment planning then occurred.  The radiation prescription was entered and confirmed.  A total of  2 complex treatment devices were fabricated which relate to the designed radiation treatment fields. Each of these customized fields/ complex treatment devices will be used on a daily basis during the radiation course. I have requested : Isodose Plan.   PLAN:  The patient will receive  30 Gy in  10 fractions.  ________________________________   Jodelle Gross, MD, PhD

## 2015-12-27 ENCOUNTER — Ambulatory Visit
Admission: RE | Admit: 2015-12-27 | Discharge: 2015-12-27 | Disposition: A | Discharge status: Home / Self Care | Payer: Medicaid Other | Source: Ambulatory Visit | Attending: Radiation Oncology | Admitting: Radiation Oncology

## 2015-12-27 DIAGNOSIS — Z510 Encounter for antineoplastic radiation therapy: Secondary | ICD-10-CM | POA: Diagnosis not present

## 2015-12-28 ENCOUNTER — Ambulatory Visit: Admission: RE | Admit: 2015-12-28 | Payer: Medicaid Other | Source: Ambulatory Visit

## 2015-12-28 ENCOUNTER — Ambulatory Visit
Admission: RE | Admit: 2015-12-28 | Discharge: 2015-12-28 | Disposition: A | Discharge status: Home / Self Care | Payer: Medicaid Other | Source: Ambulatory Visit | Attending: Radiation Oncology | Admitting: Radiation Oncology

## 2015-12-28 DIAGNOSIS — Z510 Encounter for antineoplastic radiation therapy: Secondary | ICD-10-CM | POA: Diagnosis not present

## 2015-12-29 ENCOUNTER — Ambulatory Visit: Payer: Medicaid Other | Attending: Radiation Oncology

## 2015-12-29 ENCOUNTER — Ambulatory Visit
Admission: RE | Admit: 2015-12-29 | Discharge: 2015-12-29 | Disposition: A | Discharge status: Home / Self Care | Payer: Medicaid Other | Source: Ambulatory Visit | Attending: Radiation Oncology | Admitting: Radiation Oncology

## 2015-12-29 DIAGNOSIS — Z510 Encounter for antineoplastic radiation therapy: Secondary | ICD-10-CM | POA: Diagnosis not present

## 2015-12-30 ENCOUNTER — Ambulatory Visit: Payer: Medicaid Other | Attending: Radiation Oncology

## 2015-12-30 ENCOUNTER — Ambulatory Visit
Admission: RE | Admit: 2015-12-30 | Discharge: 2015-12-30 | Disposition: A | Discharge status: Home / Self Care | Payer: Medicaid Other | Source: Ambulatory Visit | Attending: Radiation Oncology | Admitting: Radiation Oncology

## 2015-12-30 DIAGNOSIS — Z510 Encounter for antineoplastic radiation therapy: Secondary | ICD-10-CM | POA: Diagnosis not present

## 2015-12-31 ENCOUNTER — Ambulatory Visit
Admission: RE | Admit: 2015-12-31 | Discharge: 2015-12-31 | Disposition: A | Discharge status: Home / Self Care | Payer: Medicaid Other | Source: Ambulatory Visit | Attending: Radiation Oncology | Admitting: Radiation Oncology

## 2015-12-31 ENCOUNTER — Inpatient Hospital Stay
Admission: RE | Admit: 2015-12-31 | Discharge: 2015-12-31 | Disposition: A | Discharge status: Home / Self Care | Payer: Self-pay | Source: Ambulatory Visit | Attending: Radiation Oncology | Admitting: Radiation Oncology

## 2015-12-31 VITALS — BP 143/78 | HR 82 | Temp 98.2°F | Resp 18 | Ht 65.5 in | Wt 170.3 lb

## 2015-12-31 DIAGNOSIS — Z510 Encounter for antineoplastic radiation therapy: Secondary | ICD-10-CM | POA: Diagnosis not present

## 2015-12-31 DIAGNOSIS — C7951 Secondary malignant neoplasm of bone: Secondary | ICD-10-CM

## 2015-12-31 NOTE — Progress Notes (Signed)
Ruth Maldonado has received 4 fractions to left thigh.  Skin left thigh with normal color.  Appetite is good.  Ambulating without difficulty.  Pain to left thigh 6/10 taking Tylenol.  Having fatigue all during the day. Wt Readings from Last 3 Encounters:  12/31/15 170 lb 4.8 oz (77.2 kg)  12/15/15 165 lb (74.8 kg)  12/21/15 165 lb (74.8 kg)  BP (!) 143/78 (BP Location: Right Arm, Patient Position: Sitting, Cuff Size: Normal)   Pulse 82   Temp 98.2 F (36.8 C) (Oral)   Resp 18   Ht 5' 5.5" (1.664 m)   Wt 170 lb 4.8 oz (77.2 kg)   SpO2 100%   BMI 27.91 kg/m

## 2016-01-02 NOTE — Progress Notes (Signed)
Department of Radiation Oncology  Phone:  [phone] Fax:        [phone]  Weekly Treatment Note    Name: Ruth Maldonado Date: 01/02/2016 MRN: [id number] DOB: March 25, 1956   Diagnosis:     ICD-9-CM ICD-10-CM   1. Bone metastasis (HCC) 198.5 C79.51      Current dose: 12 Gy  Current fraction: 4   MEDICATIONS: Current Outpatient Prescriptions  Medication Sig Dispense Refill  . acetaminophen (TYLENOL) 325 MG tablet Take 650 mg by mouth every 6 (six) hours as needed.    . Cholecalciferol (VITAMIN D3) 1000 UNITS CAPS Take 1 capsule by mouth daily.    . hydrochlorothiazide (HYDRODIURIL) 25 MG tablet TAKE ONE TABLET BY MOUTH ONCE DAILY 30 tablet 5  . ibuprofen (ADVIL,MOTRIN) 200 MG tablet Take 200 mg by mouth every 6 (six) hours as needed for mild pain or moderate pain.    [name] Kitchen LORazepam (ATIVAN) 0.5 MG tablet Take 0.5 mg by mouth every 6 (six) hours as needed for anxiety. Reported on 09/06/2015    . losartan (COZAAR) 50 MG tablet TAKE ONE TABLET BY MOUTH ONCE DAILY 30 tablet 5  . milk thistle 175 MG tablet Take 175 mg by mouth daily as needed. Reported on 04/30/2015    . OVER THE COUNTER MEDICATION TUMERIC.  1 dropper per day.    [name] Kitchen OVER THE COUNTER MEDICATION Apply topically. LEMON GRASS ESSENTIAL OILS.  Once a day.    [name] Kitchen OVER THE COUNTER MEDICATION FRANKINCENSE.  2 drops under tongue three times daily.    [name] Kitchen OVER THE COUNTER MEDICATION JUNIPER BERRY AND GRAPEFRUIT.  Mix together take in 1 veggie capsule daily.    [name] Kitchen OVER THE COUNTER MEDICATION LAVENDER AND YLANG YLANG. Apply to bottom of feet once a day.    [name] Kitchen PRESCRIPTION MEDICATION Chemo at [facility].  Oncologist Tessie Eke.    . vitamin E 400 UNIT capsule Take 400 Units by mouth daily. Reported on 07/21/2015    . Docusate Sodium (STOOL SOFTENER) 100 MG capsule Take 100 mg by mouth 2 (two) times daily.    [name] Kitchen oxyCODONE (OXYCONTIN) 20 mg 12 hr tablet Take 20 mg by mouth every 12 (twelve) hours.    . Oxycodone HCl 10 MG TABS Take 1 tablet  (10 mg total) by mouth every 4 (four) hours as needed. (Patient not taking: Reported on 12/31/2015) 40 tablet 0  . polyethylene glycol (MIRALAX / GLYCOLAX) packet Take 17 g by mouth daily as needed. Reported on 04/30/2015    . sennosides-docusate sodium (SENOKOT-S) 8.6-50 MG tablet Take 1 tablet by mouth 2 (two) times daily.     No current facility-administered medications for this encounter.      ALLERGIES: Adhesive [tape]   LABORATORY DATA:  Lab Results  Component Value Date   WBC 3.2 (L) 12/02/2015   HGB 10.6 (L) 12/02/2015   HCT 33.4 (L) 12/02/2015   MCV 80.9 12/02/2015   PLT 190 12/02/2015   Lab Results  Component Value Date   NA 141 12/02/2015   K 3.6 12/02/2015   CL 106 12/02/2015   CO2 29 12/02/2015   Lab Results  Component Value Date   ALT 16 04/04/2015   AST 24 04/04/2015   ALKPHOS 82 04/04/2015   BILITOT 1.2 04/04/2015     NARRATIVE: Royanne Marrison was seen today for weekly treatment management. The chart was checked and the patient's films were reviewed.  Mrs. Gagan has received 4 fractions to left thigh.  Skin left thigh with normal  color.  Appetite is good.  Ambulating without difficulty.  Pain to left thigh 6/10 taking Tylenol.  Having fatigue all during the day. Wt Readings from Last 3 Encounters:  12/31/15 170 lb 4.8 oz (77.2 kg)  12/15/15 165 lb (74.8 kg)  12/21/15 165 lb (74.8 kg)  BP (!) 143/78 (BP Location: Right Arm, Patient Position: Sitting, Cuff Size: Normal)   Pulse 82   Temp 98.2 F (36.8 C) (Oral)   Resp 18   Ht 5' 5.5" (1.664 m)   Wt 170 lb 4.8 oz (77.2 kg)   SpO2 100%   BMI 27.91 kg/m   PHYSICAL EXAMINATION: height is 5' 5.5" (1.664 m) and weight is 170 lb 4.8 oz (77.2 kg). Her oral temperature is 98.2 F (36.8 C). Her blood pressure is 143/78 (abnormal) and her pulse is 82. Her respiration is 18 and oxygen saturation is 100%.        ASSESSMENT: The patient is doing satisfactorily with treatment.  The patient is doing fine  with her treatment during her first week. No difficulties. We discussed her recent imaging which has shown some increased retroperitoneal lymphadenopathy. We discussed her upcoming possible systemic treatment and her concerns about this. We also discussed the positives and negatives regarding possible radiation treatment to her increasing lymphadenopathy. The patient is very interested in undergoing some radiation treatment to this area. The patient therefore will undergo a simulation on Monday such that we can begin her treatment to this area as soon as possible and overlap with her current treatment to the maximum degree to try to shorten her treatment time overall.  PLAN: We will continue with the patient's radiation treatment as planned.

## 2016-01-03 ENCOUNTER — Telehealth: Payer: Self-pay | Admitting: *Deleted

## 2016-01-03 ENCOUNTER — Ambulatory Visit
Admission: RE | Admit: 2016-01-03 | Discharge: 2016-01-03 | Disposition: A | Discharge status: Home / Self Care | Payer: Medicaid Other | Source: Ambulatory Visit | Attending: Radiation Oncology | Admitting: Radiation Oncology

## 2016-01-03 VITALS — BP 136/85 | HR 70 | Temp 97.7°F

## 2016-01-03 DIAGNOSIS — Z510 Encounter for antineoplastic radiation therapy: Secondary | ICD-10-CM | POA: Diagnosis not present

## 2016-01-03 DIAGNOSIS — C7951 Secondary malignant neoplasm of bone: Secondary | ICD-10-CM

## 2016-01-03 MED ORDER — SODIUM CHLORIDE 0.9% FLUSH
10.0000 mL | Freq: Once | INTRAVENOUS | Status: AC
Start: 2016-01-03 — End: 2016-01-03
  Administered 2016-01-03: 10 mL via INTRAVENOUS

## 2016-01-03 MED ORDER — SODIUM CHLORIDE 0.9% FLUSH
10.0000 mL | Freq: Once | INTRAVENOUS | Status: AC
  Administered 2016-01-03: 10 mL via INTRAVENOUS

## 2016-01-03 MED ORDER — HEPARIN SOD (PORK) LOCK FLUSH 100 UNIT/ML IV SOLN
500.0000 [IU] | Freq: Once | INTRAVENOUS | Status: AC
  Administered 2016-01-03: 500 [IU] via INTRAVENOUS

## 2016-01-03 NOTE — Telephone Encounter (Signed)
Called patient  She is running late with traffic,  She is on [address] fixibng to turn on [address], 10 minutes away, asked that she still check in and come down to rad waiting room, called Ruth Maldonado, [name], patient is running a little late 8:26 AM

## 2016-01-03 NOTE — Progress Notes (Addendum)
Does patient have an allergy to IV contrast dye?: No.   Has patient ever received premedication for IV contrast dye?: No.   Does patient take metformin?No  If patient does take metformin when was the last dose: not diabetic  Date of lab work: 8/31/217 BUN: 17 CR: 0.83  IV site: Right power port, accessed using sterile technique, x 1 20 g huber needle , excellent blood return flushed with 42ml normal saline, 2x2 gause applied around port, medipore tape applied over port site,patient toleratee well , no c/ pain 9:02 AM BP 136/85 (BP Location: Left Arm, Patient Position: Sitting, Cuff Size: Normal)   Pulse 70   Temp 97.7 F (36.5 C) (Oral)   De accessed right port by Signe Colt  There were no vitals taken for this visit.

## 2016-01-04 ENCOUNTER — Ambulatory Visit
Admission: RE | Admit: 2016-01-04 | Discharge: 2016-01-04 | Disposition: A | Discharge status: Home / Self Care | Payer: Medicaid Other | Source: Ambulatory Visit | Attending: Radiation Oncology | Admitting: Radiation Oncology

## 2016-01-04 DIAGNOSIS — Z510 Encounter for antineoplastic radiation therapy: Secondary | ICD-10-CM | POA: Diagnosis not present

## 2016-01-05 ENCOUNTER — Ambulatory Visit: Payer: Medicaid Other

## 2016-01-06 ENCOUNTER — Ambulatory Visit
Admission: RE | Admit: 2016-01-06 | Discharge: 2016-01-06 | Disposition: A | Discharge status: Home / Self Care | Payer: Medicaid Other | Source: Ambulatory Visit | Attending: Radiation Oncology | Admitting: Radiation Oncology

## 2016-01-06 DIAGNOSIS — Z510 Encounter for antineoplastic radiation therapy: Secondary | ICD-10-CM | POA: Diagnosis not present

## 2016-01-07 ENCOUNTER — Ambulatory Visit
Admission: RE | Admit: 2016-01-07 | Discharge: 2016-01-07 | Disposition: A | Discharge status: Home / Self Care | Payer: Medicaid Other | Source: Ambulatory Visit | Attending: Radiation Oncology | Admitting: Radiation Oncology

## 2016-01-07 ENCOUNTER — Encounter: Payer: Self-pay | Admitting: Radiation Oncology

## 2016-01-07 VITALS — BP 128/78 | HR 67 | Temp 98.3°F | Resp 20 | Wt 168.6 lb

## 2016-01-07 DIAGNOSIS — C7951 Secondary malignant neoplasm of bone: Secondary | ICD-10-CM

## 2016-01-07 DIAGNOSIS — Z510 Encounter for antineoplastic radiation therapy: Secondary | ICD-10-CM | POA: Diagnosis not present

## 2016-01-07 NOTE — Progress Notes (Signed)
Weekly rad txs 8/10 left thigh 2/10 abdomen stated, no nausea, no diarrhea no pain, is having loose  Bowel movements, appetite good, poor energy level , takes rest periods 12:31 PM BP 128/78 (BP Location: Left Arm, Patient Position: Sitting, Cuff Size: Normal)   Pulse 67   Temp 98.3 F (36.8 C) (Oral)   Resp 20   Wt 168 lb 9.6 oz (76.5 kg)   BMI 27.63 kg/m   Wt Readings from Last 3 Encounters:  01/07/16 168 lb 9.6 oz (76.5 kg)  12/31/15 170 lb 4.8 oz (77.2 kg)  12/15/15 165 lb (74.8 kg)

## 2016-01-07 NOTE — Progress Notes (Signed)
Department of Radiation Oncology  Phone:  [phone] Fax:        [phone]  Weekly Treatment Note    Name: Ruth Maldonado Date: 01/07/2016 MRN: [id number] DOB: 12-20-1955   Diagnosis:     ICD-9-CM ICD-10-CM   1. Bone metastasis (HCC) 198.5 C79.51      Current dose: 24 Gy  Current fraction: 8   MEDICATIONS: Current Outpatient Prescriptions  Medication Sig Dispense Refill  . acetaminophen (TYLENOL) 325 MG tablet Take 650 mg by mouth every 6 (six) hours as needed.    . Cholecalciferol (VITAMIN D3) 1000 UNITS CAPS Take 1 capsule by mouth daily.    [name] Sodium (STOOL SOFTENER) 100 MG capsule Take 100 mg by mouth 2 (two) times daily.    . hydrochlorothiazide (HYDRODIURIL) 25 MG tablet TAKE ONE TABLET BY MOUTH ONCE DAILY 30 tablet 5  . ibuprofen (ADVIL,MOTRIN) 200 MG tablet Take 200 mg by mouth every 6 (six) hours as needed for mild pain or moderate pain.    [name] Kitchen LORazepam (ATIVAN) 0.5 MG tablet Take 0.5 mg by mouth every 6 (six) hours as needed for anxiety. Reported on 09/06/2015    . losartan (COZAAR) 50 MG tablet TAKE ONE TABLET BY MOUTH ONCE DAILY 30 tablet 5  . milk thistle 175 MG tablet Take 175 mg by mouth daily as needed. Reported on 04/30/2015    . OVER THE COUNTER MEDICATION TUMERIC.  1 dropper per day.    [name] Kitchen OVER THE COUNTER MEDICATION Apply topically. LEMON GRASS ESSENTIAL OILS.  Once a day.    [name] Kitchen OVER THE COUNTER MEDICATION FRANKINCENSE.  2 drops under tongue three times daily.    [name] Kitchen OVER THE COUNTER MEDICATION JUNIPER BERRY AND GRAPEFRUIT.  Mix together take in 1 veggie capsule daily.    [name] Kitchen OVER THE COUNTER MEDICATION LAVENDER AND YLANG YLANG. Apply to bottom of feet once a day.    . oxyCODONE (OXYCONTIN) 20 mg 12 hr tablet Take 20 mg by mouth every 12 (twelve) hours.    . Oxycodone HCl 10 MG TABS Take 1 tablet (10 mg total) by mouth every 4 (four) hours as needed. 40 tablet 0  . polyethylene glycol (MIRALAX / GLYCOLAX) packet Take 17 g by mouth daily as needed.  Reported on 04/30/2015    . PRESCRIPTION MEDICATION Chemo at [facility]  Oncologist Tessie Eke.    . sennosides-docusate sodium (SENOKOT-S) 8.6-50 MG tablet Take 1 tablet by mouth 2 (two) times daily.    . vitamin E 400 UNIT capsule Take 400 Units by mouth daily. Reported on 07/21/2015     No current facility-administered medications for this encounter.      ALLERGIES: Adhesive [tape]   LABORATORY DATA:  Lab Results  Component Value Date   WBC 3.2 (L) 12/02/2015   HGB 10.6 (L) 12/02/2015   HCT 33.4 (L) 12/02/2015   MCV 80.9 12/02/2015   PLT 190 12/02/2015   Lab Results  Component Value Date   NA 141 12/02/2015   K 3.6 12/02/2015   CL 106 12/02/2015   CO2 29 12/02/2015   Lab Results  Component Value Date   ALT 16 04/04/2015   AST 24 04/04/2015   ALKPHOS 82 04/04/2015   BILITOT 1.2 04/04/2015     NARRATIVE: Ruth Maldonado was seen today for weekly treatment management. The chart was checked and the patient's films were reviewed.  Weekly rad txs 8/10 left thigh 2/10 abdomen stated, no nausea, no diarrhea no pain, is having loose  Bowel movements,  appetite good, poor energy level , takes rest periods 6:13 PM BP 128/78 (BP Location: Left Arm, Patient Position: Sitting, Cuff Size: Normal)   Pulse 67   Temp 98.3 F (36.8 C) (Oral)   Resp 20   Wt 168 lb 9.6 oz (76.5 kg)   BMI 27.63 kg/m   Wt Readings from Last 3 Encounters:  01/07/16 168 lb 9.6 oz (76.5 kg)  12/31/15 170 lb 4.8 oz (77.2 kg)  12/15/15 165 lb (74.8 kg)    PHYSICAL EXAMINATION: weight is 168 lb 9.6 oz (76.5 kg). Her oral temperature is 98.3 F (36.8 C). Her blood pressure is 128/78 and her pulse is 67. Her respiration is 20.        ASSESSMENT: The patient is doing satisfactorily with treatment.  The patient feels that the left leg with metastasis has decreased in size. She has not had any nausea or other significant issue in terms of acute toxicity.  PLAN: We will continue with the patient's radiation  treatment as planned.

## 2016-01-10 ENCOUNTER — Ambulatory Visit: Payer: Medicaid Other

## 2016-01-10 ENCOUNTER — Ambulatory Visit
Admission: RE | Admit: 2016-01-10 | Discharge: 2016-01-10 | Disposition: A | Discharge status: Home / Self Care | Payer: Medicaid Other | Source: Ambulatory Visit | Attending: Radiation Oncology | Admitting: Radiation Oncology

## 2016-01-10 DIAGNOSIS — Z510 Encounter for antineoplastic radiation therapy: Secondary | ICD-10-CM | POA: Diagnosis not present

## 2016-01-11 ENCOUNTER — Ambulatory Visit: Payer: Medicaid Other

## 2016-01-11 ENCOUNTER — Ambulatory Visit
Admission: RE | Admit: 2016-01-11 | Discharge: 2016-01-11 | Disposition: A | Discharge status: Home / Self Care | Payer: Medicaid Other | Source: Ambulatory Visit | Attending: Radiation Oncology | Admitting: Radiation Oncology

## 2016-01-11 DIAGNOSIS — Z510 Encounter for antineoplastic radiation therapy: Secondary | ICD-10-CM | POA: Diagnosis not present

## 2016-01-12 ENCOUNTER — Ambulatory Visit: Payer: Medicaid Other

## 2016-01-12 ENCOUNTER — Ambulatory Visit
Admission: RE | Admit: 2016-01-12 | Discharge: 2016-01-12 | Disposition: A | Discharge status: Home / Self Care | Payer: Medicaid Other | Source: Ambulatory Visit | Attending: Radiation Oncology | Admitting: Radiation Oncology

## 2016-01-12 DIAGNOSIS — Z510 Encounter for antineoplastic radiation therapy: Secondary | ICD-10-CM | POA: Diagnosis not present

## 2016-01-13 ENCOUNTER — Ambulatory Visit
Admission: RE | Admit: 2016-01-13 | Discharge: 2016-01-13 | Disposition: A | Discharge status: Home / Self Care | Payer: Medicaid Other | Source: Ambulatory Visit | Attending: Radiation Oncology | Admitting: Radiation Oncology

## 2016-01-13 ENCOUNTER — Encounter: Payer: Self-pay | Admitting: Radiation Oncology

## 2016-01-13 VITALS — BP 157/85 | HR 65 | Temp 98.5°F | Resp 16 | Wt 169.6 lb

## 2016-01-13 DIAGNOSIS — C7951 Secondary malignant neoplasm of bone: Secondary | ICD-10-CM

## 2016-01-13 DIAGNOSIS — Z510 Encounter for antineoplastic radiation therapy: Secondary | ICD-10-CM | POA: Diagnosis not present

## 2016-01-13 NOTE — Progress Notes (Signed)
Department of Radiation Oncology  Phone:  [phone] Fax:        [phone]  Weekly Treatment Note    Name: Ruth Maldonado Date: 01/13/2016 MRN: [id number] DOB: [birth year]   Diagnosis:     ICD-9-CM ICD-10-CM   1. Bone metastasis (HCC) 198.5 C79.51      Current dose: 18 Gy (retroperitoneal lymphadenopathy)  Current fraction: 12 (total)   MEDICATIONS: Current Outpatient Prescriptions  Medication Sig Dispense Refill  . acetaminophen (TYLENOL) 325 MG tablet Take 650 mg by mouth every 6 (six) hours as needed.    . Cholecalciferol (VITAMIN D3) 1000 UNITS CAPS Take 1 capsule by mouth daily.    Ruth Maldonado Sodium (STOOL SOFTENER) 100 MG capsule Take 100 mg by mouth 2 (two) times daily.    . hydrochlorothiazide (HYDRODIURIL) 25 MG tablet TAKE ONE TABLET BY MOUTH ONCE DAILY 30 tablet 5  . ibuprofen (ADVIL,MOTRIN) 200 MG tablet Take 200 mg by mouth every 6 (six) hours as needed for mild pain or moderate pain.    [name] Kitchen LORazepam (ATIVAN) 0.5 MG tablet Take 0.5 mg by mouth every 6 (six) hours as needed for anxiety. Reported on 09/06/2015    . losartan (COZAAR) 50 MG tablet TAKE ONE TABLET BY MOUTH ONCE DAILY 30 tablet 5  . milk thistle 175 MG tablet Take 175 mg by mouth daily as needed. Reported on 04/30/2015    . OVER THE COUNTER MEDICATION TUMERIC.  1 dropper per day.    [name] Kitchen OVER THE COUNTER MEDICATION Apply topically. LEMON GRASS ESSENTIAL OILS.  Once a day.    [name] Kitchen OVER THE COUNTER MEDICATION FRANKINCENSE.  2 drops under tongue three times daily.    [name] Kitchen OVER THE COUNTER MEDICATION JUNIPER BERRY AND GRAPEFRUIT.  Mix together take in 1 veggie capsule daily.    [name] Kitchen OVER THE COUNTER MEDICATION LAVENDER AND YLANG YLANG. Apply to bottom of feet once a day.    . oxyCODONE (OXYCONTIN) 20 mg 12 hr tablet Take 20 mg by mouth every 12 (twelve) hours.    . Oxycodone HCl 10 MG TABS Take 1 tablet (10 mg total) by mouth every 4 (four) hours as needed. 40 tablet 0  . polyethylene glycol (MIRALAX / GLYCOLAX)  packet Take 17 g by mouth daily as needed. Reported on 04/30/2015    . PRESCRIPTION MEDICATION Chemo at [facility].  Oncologist Tessie Eke.    . sennosides-docusate sodium (SENOKOT-S) 8.6-50 MG tablet Take 1 tablet by mouth 2 (two) times daily.    . vitamin E 400 UNIT capsule Take 400 Units by mouth daily. Reported on 07/21/2015     No current facility-administered medications for this encounter.      ALLERGIES: Adhesive [tape]   LABORATORY DATA:  Lab Results  Component Value Date   WBC 3.2 (L) 12/02/2015   HGB 10.6 (L) 12/02/2015   HCT 33.4 (L) 12/02/2015   MCV 80.9 12/02/2015   PLT 190 12/02/2015   Lab Results  Component Value Date   NA 141 12/02/2015   K 3.6 12/02/2015   CL 106 12/02/2015   CO2 29 12/02/2015   Lab Results  Component Value Date   ALT 16 04/04/2015   AST 24 04/04/2015   ALKPHOS 82 04/04/2015   BILITOT 1.2 04/04/2015     NARRATIVE: Ruth Maldonado was seen today for weekly treatment management. The chart was checked and the patient's films were reviewed.  Weekly rad txs left thigh completed,  abdomen now 12 all so far txs done, was nauseated this  am from drinking coffee, and woke up with left sided hip side pqin, took tylenol and that helped, appetite is good, no [pain now, no diarrhea, no abdominal gas  12:49 PM BP (!) 157/85 (BP Location: Left Arm, Patient Position: Sitting, Cuff Size: Normal)   Pulse 65   Temp 98.5 F (36.9 C) (Oral)   Resp 16   Wt 169 lb 9.6 oz (76.9 kg)   BMI 27.79 kg/m   Wt Readings from Last 3 Encounters:  01/13/16 169 lb 9.6 oz (76.9 kg)  01/07/16 168 lb 9.6 oz (76.5 kg)  12/31/15 170 lb 4.8 oz (77.2 kg)       PHYSICAL EXAMINATION: weight is 169 lb 9.6 oz (76.9 kg). Her oral temperature is 98.5 F (36.9 C). Her blood pressure is 157/85 (abnormal) and her pulse is 65. Her respiration is 16.        ASSESSMENT: The patient is doing satisfactorily with treatment.  PLAN: We will continue with the patient's radiation treatment  as planned. The patient feels that her leg is markedly improved and she is able to ambulate well. Minimal nausea currently which is well tolerated.

## 2016-01-13 NOTE — Progress Notes (Signed)
Weekly rad txs left thigh completed,  abdomen now 12 all so far txs done, was nauseated this am from drinking coffee, and woke up with left sided hip side pqin, took tylenol and that helped, appetite is good, no [pain now, no diarrhea, no abdominal gas  11:31 AM BP (!) 157/85 (BP Location: Left Arm, Patient Position: Sitting, Cuff Size: Normal)   Pulse 65   Temp 98.5 F (36.9 C) (Oral)   Resp 16   Wt 169 lb 9.6 oz (76.9 kg)   BMI 27.79 kg/m   Wt Readings from Last 3 Encounters:  01/13/16 169 lb 9.6 oz (76.9 kg)  01/07/16 168 lb 9.6 oz (76.5 kg)  12/31/15 170 lb 4.8 oz (77.2 kg)

## 2016-01-13 NOTE — Progress Notes (Signed)
  Radiation Oncology         [phone] ________________________________  Name: Ruth Maldonado MRN: [id number]  Date: 01/03/2016  DOB: [birth year]  SIMULATION AND TREATMENT PLANNING NOTE  DIAGNOSIS:     ICD-9-CM ICD-10-CM   1. Bone metastasis ([place]) 198.5 C79.51      Site:  Abdomen  NARRATIVE:  The patient was brought to the [facility].  Identity was confirmed.  All relevant records and images related to the planned course of therapy were reviewed.   Written consent to proceed with treatment was confirmed which was freely given after reviewing the details related to the planned course of therapy had been reviewed with the patient.  Then, the patient was set-up in a stable reproducible  supine position for radiation therapy.  CT images were obtained.  Surface markings were placed.    Medically necessary complex treatment device(s) for immobilization:  Customized vac lock bag.   The CT images were loaded into the planning software.  Then the target and avoidance structures were contoured.  Treatment planning then occurred.  The radiation prescription was entered and confirmed.  A total of 4 complex treatment devices were fabricated which relate to the designed radiation treatment fields. Each of these customized fields/ complex treatment devices will be used on a daily basis during the radiation course. I have requested : 3D Simulation  I have requested a DVH of the following structures: Target volume, spinal cord, left kidney, right kidney, bowels.   PLAN:  The patient will receive 30 Gy in 10 fractions.  ________________________________   Jodelle Gross, MD, PhD

## 2016-01-14 ENCOUNTER — Ambulatory Visit
Admission: RE | Admit: 2016-01-14 | Discharge: 2016-01-14 | Disposition: A | Discharge status: Home / Self Care | Payer: Medicaid Other | Source: Ambulatory Visit | Attending: Radiation Oncology | Admitting: Radiation Oncology

## 2016-01-14 DIAGNOSIS — Z510 Encounter for antineoplastic radiation therapy: Secondary | ICD-10-CM | POA: Diagnosis not present

## 2016-01-15 ENCOUNTER — Ambulatory Visit: Payer: Medicaid Other

## 2016-01-17 ENCOUNTER — Ambulatory Visit
Admission: RE | Admit: 2016-01-17 | Discharge: 2016-01-17 | Disposition: A | Discharge status: Home / Self Care | Payer: Medicaid Other | Source: Ambulatory Visit | Attending: Radiation Oncology | Admitting: Radiation Oncology

## 2016-01-17 DIAGNOSIS — Z510 Encounter for antineoplastic radiation therapy: Secondary | ICD-10-CM | POA: Diagnosis not present

## 2016-01-18 ENCOUNTER — Ambulatory Visit
Admission: RE | Admit: 2016-01-18 | Discharge: 2016-01-18 | Disposition: A | Discharge status: Home / Self Care | Payer: Medicaid Other | Source: Ambulatory Visit | Attending: Radiation Oncology | Admitting: Radiation Oncology

## 2016-01-18 ENCOUNTER — Ambulatory Visit: Payer: Medicaid Other

## 2016-01-18 DIAGNOSIS — Z510 Encounter for antineoplastic radiation therapy: Secondary | ICD-10-CM | POA: Diagnosis not present

## 2016-01-19 ENCOUNTER — Telehealth: Payer: Self-pay | Admitting: *Deleted

## 2016-01-19 ENCOUNTER — Ambulatory Visit
Admission: RE | Admit: 2016-01-19 | Discharge: 2016-01-19 | Disposition: A | Discharge status: Home / Self Care | Payer: Medicaid Other | Source: Ambulatory Visit | Attending: Radiation Oncology | Admitting: Radiation Oncology

## 2016-01-19 ENCOUNTER — Encounter: Payer: Self-pay | Admitting: Radiation Oncology

## 2016-01-19 DIAGNOSIS — Z510 Encounter for antineoplastic radiation therapy: Secondary | ICD-10-CM | POA: Diagnosis not present

## 2016-01-19 NOTE — Telephone Encounter (Signed)
Went outside looking for patient, she left  Without being seen her last day of rad, txc, she stated she was just tires  Lately, but otherwise doing okay,, She is almost home now, will mail her 1 month follow up on 03/07/16 with our PA Shona Simpson, patient stated to apologize to Dr,. Moody she didn't think she needed to come around as she asked yesterday if she needed to be seen,was told they didn't think so for 30 days 12:37 PM

## 2016-01-20 NOTE — Progress Notes (Signed)
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      Indication for treatment:  Curative     Radiation treatment dates:   12/28/2015 to 01/19/2016   Site/dose:    1. The left medial thigh was treated to 30 Gy in 10 fractions at 3 Gy per fraction. 2. The abdomen was treated to 30 Gy in 10 fractions at 3 Gy per fraction.  Beams/energy:    1. Isodose plan // 15X 2. 3D // 15X, 6X  Narrative: The patient tolerated radiation treatment relatively well.  She experienced minimal nausea which was well tolerated. With treatment she felt that her leg was markedly improved and saw was able to ambulate well.    Plan: The patient has completed radiation treatment. The patient will return to radiation oncology clinic for routine followup in one month. I advised them to call or return sooner if they have any questions or concerns related to their recovery or treatment.  ------------------------------------------------  Jodelle Gross, MD, PhD  This document serves as a record of services personally performed by Kyung Rudd, MD. It was created on his behalf by Arlyce Harman, a trained medical scribe. The creation of this record is based on the scribe's personal observations and the provider's statements to them. This document has been checked and approved by the attending provider.

## 2016-03-07 ENCOUNTER — Ambulatory Visit
Admission: RE | Admit: 2016-03-07 | Discharge: 2016-03-07 | Disposition: A | Discharge status: Home / Self Care | Payer: Medicaid Other | Source: Ambulatory Visit | Attending: Radiation Oncology | Admitting: Radiation Oncology

## 2016-10-02 ENCOUNTER — Ambulatory Visit
Admission: RE | Admit: 2016-10-02 | Discharge: 2016-10-02 | Disposition: A | Discharge status: Home / Self Care | Admitting: Hematology & Oncology

## 2016-10-02 ENCOUNTER — Ambulatory Visit
Admission: RE | Admit: 2016-10-02 | Discharge: 2016-10-02 | Disposition: A | Discharge status: Home / Self Care | Payer: MEDICAID

## 2016-10-02 ENCOUNTER — Ambulatory Visit
Admission: RE | Admit: 2016-10-02 | Discharge: 2016-10-02 | Disposition: A | Discharge status: Home / Self Care | Payer: MEDICAID | Attending: Nurse Practitioner | Admitting: Nurse Practitioner

## 2016-10-02 DIAGNOSIS — C679 Malignant neoplasm of bladder, unspecified: Principal | ICD-10-CM

## 2016-10-02 DIAGNOSIS — C689 Malignant neoplasm of urinary organ, unspecified: Principal | ICD-10-CM

## 2016-10-16 MED ORDER — TRIAMCINOLONE ACETONIDE 0.1 % TOPICAL CREAM
Freq: Two times a day (BID) | TOPICAL | 2 refills | 0 days | Status: CP
Start: 2016-10-16 — End: 2017-10-16

## 2016-10-26 ENCOUNTER — Ambulatory Visit
Admission: RE | Admit: 2016-10-26 | Discharge: 2016-10-26 | Disposition: A | Discharge status: Home / Self Care | Payer: MEDICAID

## 2016-10-26 DIAGNOSIS — C679 Malignant neoplasm of bladder, unspecified: Principal | ICD-10-CM

## 2016-10-30 ENCOUNTER — Ambulatory Visit: Admission: RE | Admit: 2016-10-30 | Discharge: 2016-10-30 | Disposition: A | Discharge status: Home / Self Care

## 2016-10-30 ENCOUNTER — Ambulatory Visit
Admission: RE | Admit: 2016-10-30 | Discharge: 2016-10-30 | Disposition: A | Discharge status: Home / Self Care | Attending: Nurse Practitioner | Admitting: Nurse Practitioner

## 2016-10-30 DIAGNOSIS — C689 Malignant neoplasm of urinary organ, unspecified: Principal | ICD-10-CM

## 2016-10-30 DIAGNOSIS — C679 Malignant neoplasm of bladder, unspecified: Principal | ICD-10-CM

## 2016-10-30 DIAGNOSIS — C778 Secondary and unspecified malignant neoplasm of lymph nodes of multiple regions: Secondary | ICD-10-CM

## 2016-12-11 ENCOUNTER — Ambulatory Visit
Admission: RE | Admit: 2016-12-11 | Discharge: 2016-12-11 | Disposition: A | Discharge status: Home / Self Care | Payer: MEDICAID

## 2016-12-11 ENCOUNTER — Ambulatory Visit
Admission: RE | Admit: 2016-12-11 | Discharge: 2016-12-11 | Disposition: A | Discharge status: Home / Self Care | Payer: MEDICAID | Attending: Nurse Practitioner

## 2016-12-11 DIAGNOSIS — C689 Malignant neoplasm of urinary organ, unspecified: Principal | ICD-10-CM

## 2016-12-11 DIAGNOSIS — C679 Malignant neoplasm of bladder, unspecified: Secondary | ICD-10-CM

## 2017-01-01 ENCOUNTER — Ambulatory Visit: Admission: RE | Admit: 2017-01-01 | Discharge: 2017-01-01 | Disposition: A | Discharge status: Home / Self Care

## 2017-01-01 ENCOUNTER — Ambulatory Visit
Admission: RE | Admit: 2017-01-01 | Discharge: 2017-01-01 | Disposition: A | Discharge status: Home / Self Care | Attending: Nurse Practitioner

## 2017-01-01 DIAGNOSIS — C679 Malignant neoplasm of bladder, unspecified: Principal | ICD-10-CM

## 2017-01-01 DIAGNOSIS — C689 Malignant neoplasm of urinary organ, unspecified: Principal | ICD-10-CM

## 2017-01-18 ENCOUNTER — Ambulatory Visit
Admission: RE | Admit: 2017-01-18 | Discharge: 2017-01-18 | Disposition: A | Discharge status: Home / Self Care | Payer: MEDICAID

## 2017-01-18 DIAGNOSIS — C689 Malignant neoplasm of urinary organ, unspecified: Principal | ICD-10-CM

## 2017-01-22 ENCOUNTER — Ambulatory Visit
Admission: RE | Admit: 2017-01-22 | Discharge: 2017-01-22 | Disposition: A | Discharge status: Home / Self Care | Payer: MEDICAID

## 2017-01-22 ENCOUNTER — Ambulatory Visit
Admission: RE | Admit: 2017-01-22 | Discharge: 2017-01-22 | Disposition: A | Discharge status: Home / Self Care | Payer: MEDICAID | Attending: Internal Medicine

## 2017-01-22 ENCOUNTER — Ambulatory Visit
Admission: RE | Admit: 2017-01-22 | Discharge: 2017-01-22 | Disposition: A | Discharge status: Home / Self Care | Payer: MEDICAID | Attending: Registered" | Admitting: Registered"

## 2017-01-22 DIAGNOSIS — Z713 Dietary counseling and surveillance: Principal | ICD-10-CM

## 2017-01-22 DIAGNOSIS — C679 Malignant neoplasm of bladder, unspecified: Secondary | ICD-10-CM

## 2017-01-22 DIAGNOSIS — C689 Malignant neoplasm of urinary organ, unspecified: Secondary | ICD-10-CM

## 2017-01-22 DIAGNOSIS — C778 Secondary and unspecified malignant neoplasm of lymph nodes of multiple regions: Principal | ICD-10-CM

## 2017-02-12 ENCOUNTER — Ambulatory Visit
Admission: RE | Admit: 2017-02-12 | Discharge: 2017-02-12 | Disposition: A | Discharge status: Home / Self Care | Payer: MEDICAID | Attending: Registered" | Admitting: Registered"

## 2017-02-12 ENCOUNTER — Ambulatory Visit: Admission: RE | Admit: 2017-02-12 | Discharge: 2017-02-12 | Disposition: A | Discharge status: Home / Self Care

## 2017-02-12 ENCOUNTER — Ambulatory Visit
Admission: RE | Admit: 2017-02-12 | Discharge: 2017-02-12 | Disposition: A | Discharge status: Home / Self Care | Payer: MEDICAID

## 2017-02-12 ENCOUNTER — Ambulatory Visit
Admission: RE | Admit: 2017-02-12 | Discharge: 2017-02-12 | Disposition: A | Discharge status: Home / Self Care | Attending: Nurse Practitioner | Admitting: Nurse Practitioner

## 2017-02-12 DIAGNOSIS — C689 Malignant neoplasm of urinary organ, unspecified: Principal | ICD-10-CM

## 2017-02-12 DIAGNOSIS — C679 Malignant neoplasm of bladder, unspecified: Secondary | ICD-10-CM

## 2017-02-12 DIAGNOSIS — C778 Secondary and unspecified malignant neoplasm of lymph nodes of multiple regions: Secondary | ICD-10-CM

## 2017-02-12 MED ORDER — LIDOCAINE-PRILOCAINE 2.5 %-2.5 % TOPICAL CREAM
Freq: Once | TOPICAL | 3 refills | 0 days | Status: CP
Start: 2017-02-12 — End: 2017-02-12

## 2017-03-19 ENCOUNTER — Ambulatory Visit
Admission: RE | Admit: 2017-03-19 | Discharge: 2017-03-19 | Disposition: A | Discharge status: Home / Self Care | Attending: Clinical | Admitting: Clinical

## 2017-03-19 ENCOUNTER — Ambulatory Visit
Admission: RE | Admit: 2017-03-19 | Discharge: 2017-03-19 | Disposition: A | Discharge status: Home / Self Care | Payer: MEDICAID

## 2017-03-19 ENCOUNTER — Ambulatory Visit: Admission: RE | Admit: 2017-03-19 | Discharge: 2017-03-19 | Disposition: A | Discharge status: Home / Self Care

## 2017-03-19 ENCOUNTER — Ambulatory Visit
Admission: RE | Admit: 2017-03-19 | Discharge: 2017-03-19 | Disposition: A | Discharge status: Home / Self Care | Payer: MEDICAID | Attending: Internal Medicine | Admitting: Internal Medicine

## 2017-03-19 DIAGNOSIS — C679 Malignant neoplasm of bladder, unspecified: Secondary | ICD-10-CM

## 2017-03-19 DIAGNOSIS — C689 Malignant neoplasm of urinary organ, unspecified: Principal | ICD-10-CM

## 2017-03-19 DIAGNOSIS — C778 Secondary and unspecified malignant neoplasm of lymph nodes of multiple regions: Principal | ICD-10-CM

## 2017-04-09 ENCOUNTER — Ambulatory Visit: Admit: 2017-04-09 | Discharge: 2017-04-09 | Payer: MEDICARE | Attending: Internal Medicine | Primary: Internal Medicine

## 2017-04-09 ENCOUNTER — Encounter: Admit: 2017-04-09 | Discharge: 2017-04-09 | Payer: MEDICARE

## 2017-04-09 DIAGNOSIS — C689 Malignant neoplasm of urinary organ, unspecified: Principal | ICD-10-CM

## 2017-04-09 DIAGNOSIS — C679 Malignant neoplasm of bladder, unspecified: Secondary | ICD-10-CM

## 2017-04-26 ENCOUNTER — Encounter: Admit: 2017-04-26 | Discharge: 2017-04-27 | Payer: MEDICARE

## 2017-04-26 DIAGNOSIS — R2231 Localized swelling, mass and lump, right upper limb: Principal | ICD-10-CM

## 2017-04-26 DIAGNOSIS — C689 Malignant neoplasm of urinary organ, unspecified: Principal | ICD-10-CM

## 2017-04-30 ENCOUNTER — Encounter: Admit: 2017-04-30 | Discharge: 2017-04-30 | Payer: MEDICARE

## 2017-04-30 ENCOUNTER — Other Ambulatory Visit: Admit: 2017-04-30 | Discharge: 2017-04-30 | Payer: MEDICARE

## 2017-04-30 ENCOUNTER — Encounter: Admit: 2017-04-30 | Discharge: 2017-04-30 | Payer: MEDICARE | Attending: Internal Medicine | Primary: Internal Medicine

## 2017-04-30 DIAGNOSIS — C689 Malignant neoplasm of urinary organ, unspecified: Principal | ICD-10-CM

## 2017-04-30 DIAGNOSIS — C679 Malignant neoplasm of bladder, unspecified: Secondary | ICD-10-CM

## 2017-05-08 ENCOUNTER — Encounter: Admit: 2017-05-08 | Discharge: 2017-05-09 | Payer: MEDICARE

## 2017-05-08 DIAGNOSIS — R51 Headache: Principal | ICD-10-CM

## 2017-05-31 MED ORDER — LOSARTAN 50 MG TABLET
ORAL_TABLET | 3 refills | 0 days | Status: CP
Start: 2017-05-31 — End: 2018-06-13

## 2017-05-31 MED ORDER — HYDROCHLOROTHIAZIDE 25 MG TABLET
ORAL_TABLET | 3 refills | 0 days | Status: CP
Start: 2017-05-31 — End: 2018-06-13

## 2017-06-28 ENCOUNTER — Encounter: Admit: 2017-06-28 | Discharge: 2017-06-28 | Payer: MEDICARE

## 2017-06-28 DIAGNOSIS — C689 Malignant neoplasm of urinary organ, unspecified: Principal | ICD-10-CM

## 2017-07-02 ENCOUNTER — Encounter: Admit: 2017-07-02 | Discharge: 2017-07-03 | Payer: MEDICARE

## 2017-07-02 ENCOUNTER — Ambulatory Visit: Admit: 2017-07-02 | Discharge: 2017-07-03 | Payer: MEDICARE | Attending: Internal Medicine | Primary: Internal Medicine

## 2017-07-02 DIAGNOSIS — C689 Malignant neoplasm of urinary organ, unspecified: Principal | ICD-10-CM

## 2017-07-02 DIAGNOSIS — C679 Malignant neoplasm of bladder, unspecified: Secondary | ICD-10-CM

## 2017-08-02 ENCOUNTER — Encounter: Admit: 2017-08-02 | Discharge: 2017-08-03 | Payer: MEDICARE

## 2017-08-02 DIAGNOSIS — C689 Malignant neoplasm of urinary organ, unspecified: Principal | ICD-10-CM

## 2017-08-02 DIAGNOSIS — C679 Malignant neoplasm of bladder, unspecified: Secondary | ICD-10-CM

## 2017-08-30 ENCOUNTER — Encounter: Admit: 2017-08-30 | Discharge: 2017-08-30 | Payer: MEDICARE

## 2017-08-30 DIAGNOSIS — C689 Malignant neoplasm of urinary organ, unspecified: Principal | ICD-10-CM

## 2017-09-06 ENCOUNTER — Encounter: Admit: 2017-09-06 | Discharge: 2017-09-07 | Payer: MEDICARE

## 2017-09-06 ENCOUNTER — Encounter: Admit: 2017-09-06 | Discharge: 2017-09-07 | Payer: MEDICARE | Attending: Internal Medicine | Primary: Internal Medicine

## 2017-09-06 DIAGNOSIS — C778 Secondary and unspecified malignant neoplasm of lymph nodes of multiple regions: Principal | ICD-10-CM

## 2017-09-06 DIAGNOSIS — C679 Malignant neoplasm of bladder, unspecified: Secondary | ICD-10-CM

## 2017-10-08 ENCOUNTER — Encounter: Admit: 2017-10-08 | Discharge: 2017-10-09 | Payer: MEDICARE

## 2017-10-08 DIAGNOSIS — C679 Malignant neoplasm of bladder, unspecified: Principal | ICD-10-CM

## 2017-12-11 ENCOUNTER — Encounter: Admit: 2017-12-11 | Discharge: 2017-12-11 | Payer: MEDICARE

## 2017-12-11 DIAGNOSIS — C778 Secondary and unspecified malignant neoplasm of lymph nodes of multiple regions: Principal | ICD-10-CM

## 2017-12-13 ENCOUNTER — Ambulatory Visit: Admit: 2017-12-13 | Discharge: 2017-12-14 | Payer: MEDICARE | Attending: Internal Medicine | Primary: Internal Medicine

## 2017-12-13 ENCOUNTER — Encounter: Admit: 2017-12-13 | Discharge: 2017-12-14 | Payer: MEDICARE

## 2017-12-13 DIAGNOSIS — C778 Secondary and unspecified malignant neoplasm of lymph nodes of multiple regions: Principal | ICD-10-CM

## 2017-12-13 DIAGNOSIS — C679 Malignant neoplasm of bladder, unspecified: Secondary | ICD-10-CM

## 2018-02-05 ENCOUNTER — Other Ambulatory Visit: Admit: 2018-02-05 | Discharge: 2018-02-06 | Payer: MEDICARE

## 2018-03-12 ENCOUNTER — Encounter: Admit: 2018-03-12 | Discharge: 2018-03-12 | Payer: MEDICARE

## 2018-03-12 DIAGNOSIS — C778 Secondary and unspecified malignant neoplasm of lymph nodes of multiple regions: Principal | ICD-10-CM

## 2018-03-14 ENCOUNTER — Encounter: Admit: 2018-03-14 | Discharge: 2018-03-15 | Payer: MEDICARE

## 2018-03-14 ENCOUNTER — Ambulatory Visit: Admit: 2018-03-14 | Discharge: 2018-03-15 | Payer: MEDICARE | Attending: Internal Medicine | Primary: Internal Medicine

## 2018-03-14 DIAGNOSIS — C778 Secondary and unspecified malignant neoplasm of lymph nodes of multiple regions: Principal | ICD-10-CM

## 2018-03-14 DIAGNOSIS — C679 Malignant neoplasm of bladder, unspecified: Secondary | ICD-10-CM

## 2018-06-10 DIAGNOSIS — C778 Secondary and unspecified malignant neoplasm of lymph nodes of multiple regions: Principal | ICD-10-CM

## 2018-06-11 ENCOUNTER — Encounter: Admit: 2018-06-11 | Discharge: 2018-06-11 | Payer: MEDICARE

## 2018-06-11 DIAGNOSIS — C679 Malignant neoplasm of bladder, unspecified: Principal | ICD-10-CM

## 2018-06-11 DIAGNOSIS — M898X8 Other specified disorders of bone, other site: Principal | ICD-10-CM

## 2018-06-11 DIAGNOSIS — C778 Secondary and unspecified malignant neoplasm of lymph nodes of multiple regions: Principal | ICD-10-CM

## 2018-06-11 DIAGNOSIS — M898X5 Other specified disorders of bone, thigh: Principal | ICD-10-CM

## 2018-06-13 ENCOUNTER — Encounter: Admit: 2018-06-13 | Discharge: 2018-06-13 | Payer: MEDICARE

## 2018-06-13 ENCOUNTER — Encounter: Admit: 2018-06-13 | Discharge: 2018-06-13 | Payer: MEDICARE | Attending: Internal Medicine | Primary: Internal Medicine

## 2018-06-13 DIAGNOSIS — E785 Hyperlipidemia, unspecified: Principal | ICD-10-CM

## 2018-06-13 DIAGNOSIS — M5410 Radiculopathy, site unspecified: Principal | ICD-10-CM

## 2018-06-13 DIAGNOSIS — Z95828 Presence of other vascular implants and grafts: Principal | ICD-10-CM

## 2018-06-13 DIAGNOSIS — C772 Secondary and unspecified malignant neoplasm of intra-abdominal lymph nodes: Principal | ICD-10-CM

## 2018-06-13 DIAGNOSIS — R1900 Intra-abdominal and pelvic swelling, mass and lump, unspecified site: Principal | ICD-10-CM

## 2018-06-13 DIAGNOSIS — C673 Malignant neoplasm of anterior wall of bladder: Principal | ICD-10-CM

## 2018-06-13 DIAGNOSIS — K769 Liver disease, unspecified: Principal | ICD-10-CM

## 2018-06-13 DIAGNOSIS — Z87891 Personal history of nicotine dependence: Principal | ICD-10-CM

## 2018-06-13 DIAGNOSIS — I10 Essential (primary) hypertension: Principal | ICD-10-CM

## 2018-06-13 DIAGNOSIS — R946 Abnormal results of thyroid function studies: Principal | ICD-10-CM

## 2018-06-13 DIAGNOSIS — M5124 Other intervertebral disc displacement, thoracic region: Principal | ICD-10-CM

## 2018-06-13 DIAGNOSIS — Z801 Family history of malignant neoplasm of trachea, bronchus and lung: Principal | ICD-10-CM

## 2018-06-13 DIAGNOSIS — M4804 Spinal stenosis, thoracic region: Principal | ICD-10-CM

## 2018-06-13 DIAGNOSIS — C7951 Secondary malignant neoplasm of bone: Principal | ICD-10-CM

## 2018-06-13 DIAGNOSIS — N200 Calculus of kidney: Principal | ICD-10-CM

## 2018-06-13 DIAGNOSIS — R51 Headache: Principal | ICD-10-CM

## 2018-06-13 DIAGNOSIS — C679 Malignant neoplasm of bladder, unspecified: Principal | ICD-10-CM

## 2018-06-13 DIAGNOSIS — C778 Secondary and unspecified malignant neoplasm of lymph nodes of multiple regions: Principal | ICD-10-CM

## 2018-06-13 DIAGNOSIS — B182 Chronic viral hepatitis C: Principal | ICD-10-CM

## 2018-06-13 DIAGNOSIS — B1920 Unspecified viral hepatitis C without hepatic coma: Principal | ICD-10-CM

## 2018-06-13 DIAGNOSIS — Z923 Personal history of irradiation: Principal | ICD-10-CM

## 2018-06-13 MED ORDER — LOSARTAN 50 MG TABLET
ORAL_TABLET | Freq: Every day | ORAL | 3 refills | 0.00000 days | Status: CP
Start: 2018-06-13 — End: ?

## 2018-06-13 MED ORDER — HYDROCHLOROTHIAZIDE 25 MG TABLET
ORAL_TABLET | Freq: Every day | ORAL | 3 refills | 0 days | Status: CP
Start: 2018-06-13 — End: ?

## 2018-09-17 ENCOUNTER — Ambulatory Visit: Admit: 2018-09-17 | Discharge: 2018-09-17 | Payer: MEDICARE

## 2018-09-17 DIAGNOSIS — C679 Malignant neoplasm of bladder, unspecified: Principal | ICD-10-CM

## 2018-09-19 ENCOUNTER — Encounter: Admit: 2018-09-19 | Discharge: 2018-09-20 | Payer: MEDICARE | Attending: Internal Medicine | Primary: Internal Medicine

## 2018-09-19 ENCOUNTER — Encounter: Admit: 2018-09-19 | Discharge: 2018-09-20 | Payer: MEDICARE

## 2018-09-19 DIAGNOSIS — C689 Malignant neoplasm of urinary organ, unspecified: Principal | ICD-10-CM

## 2018-09-19 DIAGNOSIS — C679 Malignant neoplasm of bladder, unspecified: Secondary | ICD-10-CM

## 2018-09-19 MED ORDER — LIDOCAINE-PRILOCAINE 2.5 %-2.5 % TOPICAL CREAM
0 refills | 0 days | Status: CP
Start: 2018-09-19 — End: 2019-09-19

## 2018-11-21 ENCOUNTER — Telehealth: Admit: 2018-11-21 | Discharge: 2018-11-22 | Payer: MEDICARE

## 2018-11-21 DIAGNOSIS — B182 Chronic viral hepatitis C: Principal | ICD-10-CM

## 2018-12-25 DIAGNOSIS — B182 Chronic viral hepatitis C: Secondary | ICD-10-CM

## 2018-12-31 ENCOUNTER — Institutional Professional Consult (permissible substitution): Admit: 2018-12-31 | Discharge: 2019-01-01 | Payer: MEDICARE

## 2018-12-31 DIAGNOSIS — B182 Chronic viral hepatitis C: Secondary | ICD-10-CM

## 2019-02-17 DIAGNOSIS — B182 Chronic viral hepatitis C: Principal | ICD-10-CM

## 2019-02-17 MED ORDER — SOFOSBUVIR 400 MG-VELPATASVIR 100 MG TABLET
ORAL_TABLET | Freq: Every day | ORAL | 2 refills | 28 days | Status: CP
Start: 2019-02-17 — End: ?
  Filled 2019-02-25: qty 28, 28d supply, fill #0

## 2019-02-18 DIAGNOSIS — B182 Chronic viral hepatitis C: Principal | ICD-10-CM

## 2019-02-24 NOTE — Unmapped (Signed)
[facility] Specialty Medication Onboarding    Specialty Medication: SOFOSBUVIR-VELPATASVIR  Prior Authorization: Approved   Financial Assistance: No - copay  <$25  Final Copay/Day Supply: $3.64 / 28 DAYS    Insurance Restrictions: None     Notes to Pharmacist:     The triage team has completed the benefits investigation and has determined that the patient is able to fill this medication at [facility]. Please contact the patient to complete the onboarding or follow up with the prescribing physician as needed.

## 2019-02-24 NOTE — Unmapped (Signed)
[facility]   Patient Onboarding/Medication Counseling    Vanessa Montoya is a 63 y.o. female with Hepatitis C who I am counseling today on initiation of therapy.  I am speaking to the patient.    Verified patient's date of birth / HIPAA.    Specialty medication(s) to be sent: Infectious Disease: sofosbuvir/velpatasvir      Non-specialty medications/supplies to be sent: n/a      Medications not needed at this time: n/a         Epclusa (sofosbuvir and velpatasvir) 400mg /100mg     Planned Start Date: 02/27/2019    Has the patient been told to call and make a 4 week follow-up appointment at the liver clinic after their medicine has been received? Yes, patient was told to call [phone], option 1, option 1      Medication & Administration     Dosage: Take one tablet by mouth daily for 12 weeks.    Administration:  ? Take with or without food.  ? Antacids: Separate the administration of Epclusa and antacids by at least 4 hours.  ? H2 Receptor Antagonist: Administer H2 receptor antagonist doses less than or comparable to famotidine 40 mg twice daily simultaneously or 12 hours apart from [place].   ? Proton Pump Inhibitor (PPI): Epclusa should be administered with food and taken 4 hours before omeprazole max dose of 20mg .      Adherence/Missed dose instructions:  ? Take missed dose as soon as you remember. If it is close to the time of your next dose, skip the missed dose and resume with your next scheduled dose.  ? Do not take extra doses or 2 doses at the same time.  ? Avoid missing doses as it can result in development of resistance.    Goals of Therapy     The goal of therapy is to cure the patient of Hepatitis C. Patients who have undetectable HCV RNA in the serum, as assessed by a sensitive polymerase chain reaction (PCR) assay, ?12 weeks after treatment completion are deemed to have achieved SVR (cure). (www.hcvguidelines.org)    Side Effects & Monitoring Parameters     Common Side Effects: ? Headache  ? Fatigue     To help minimize some of the side effects: (http://www.velazquez.com/)  ? Stay hydrated by drinking plenty of water and limiting caffeinated beverages such as coffee, tea and soda.  ? Do your hardest chores when you have the most energy.  ? Take short naps of 20 minutes or less that are and not too close to bedtime.    The following side effects should be reported to the provider:  ? Signs of liver problems like dark urine, feeling excessively tired, lack of hunger, upset stomach or stomach pain, light-colored stools, throwing up, or yellow skin/eyes.  ? Signs of an allergic reaction, such as rash; hives; itching; red, swollen, blistered, or peeling skin with or without fever. If you have wheezing; tightness in the chest or throat; trouble breathing, swallowing, or talking; unusual hoarseness; or swelling of the mouth, face, lips, tongue, or throat, call 911 or go to the closest emergency department (ED).     Monitoring Parameters: (AASLD-IDSA Guidelines, 2019)    Within 6 months prior to starting treatment:  ? Complete blood count (CBC).  ? International normalized ratio (INR) if clinically indicated.   ? Hepatic function panel: albumin, total and direct bilirubin, ALT, AST, and Alkaline Phosphatase levels.  ? Calculated glomerular filtration rate (eGFR).  ?  Pregnancy test within 1 month of starting treatment for females of childbearing age  Any time prior to starting treatment:  ? Test for HCV genotype.  ? Assess for hepatic fibrosis.  ? Quantitative HCV RNA (HCV viral load).  ? Assess for active HBV coinfection: HBV surface antigen (HBsAg); If HBsAg positive, then should be assessed for whether HBV DNA level meets AASLD criteria for HBV treatment.  ? Assess for evidence of prior HBV infection and immunity: HBV core antibody (anti-HBc) and HBV surface antibody (anti-HBS).   ? Assess for HIV coinfection. ? Test for the presence of resistance-associated substitutions (RASs) prior to starting treatment if clinically indicated.    Monitoring during treatment:   ? Diabetics should monitor for signs of hypoglycemia.  ? Patients on warfarin should monitor for changes in INR.  ? Pregnancy test monthly in females of childbearing age  ? For HBsAg positive patients not already on HBV therapy because baseline HBV DNA level does not meet treatment criteria:   o Initiate prophylactic HBV antiviral therapy OR  o Monitor HBV DNA levels monthly during and immediately after Epclusa therapy.    After treatment to document a cure:   ? Quantitative HCV RNA12 weeks after completion of therapy.    Contraindications, Warnings, & Precautions     ? Hepatitis B reactivation.  ? Bradycardia with amiodarone coadministration: Coadministration not recommended. If unavoidable, patients should have inpatient cardiac monitoring for 48 hours after first Epclusa dose, followed by daily outpatient or self-monitoring of heart rate for at least the first 2 weeks of treatment. Due to the long half-life of amiodarone, cardiac monitoring is also recommended if amiodarone was discontinued just prior to beginning treatment.     Drug/Food Interactions     ? Medication list reviewed in Epic. The patient was instructed to inform the care team before taking any new medications or supplements. I reminded patient to not take frankincense since it can inhibit the metabolism of the Epclusa.  ? Encourage minimizing alcohol intake.  ? Antacids: Separate the administration of Epclusa and antacids by at least 4 hours.  ? H2 Receptor Antagonists: Administer H2 receptor antagonist doses less than or comparable to famotidine 40 mg twice daily simultaneously or 12 hours apart from [place]. ? Proton pump inhibitors: Not recommended. If medically necessary, Epclusa should be administered with food and taken 4 hours before omeprazole at a maximum dose of 20mg . Other proton pump inhibitors have not been studied.  ? HMG-CoA reductase inhibitors: monitor for signs of statin toxicity such as myopathy including rhabdomyolysis. Dose reduction of statin may be necessary. Ask patients to self-report potential side effects of increased concentrations such as muscle pain.  o Rosuvastatin dose should not exceed 10mg .  o Atorvastatin : Consider dose reduction if patient's dose is greater than 40mg .  o Pravastatin: no clinically significant interaction.  ? Potential interactions: Clearance of HCV infection with direct acting antivirals may lead to changes in hepatic function, which may impact the safe and effective use of concomitant medications. Frequent monitoring of relevant laboratory parameters (e.g., International Normalized Ratio [INR] in patients taking warfarin, blood glucose levels in diabetic patients) or drug concentrations of concomitant medications such as cytochrome P450 substrates with a narrow therapeutic index (e.g. certain immunosuppressants) is recommended to ensure safe and effective use. Dose adjustments of concomitant medications may be necessary.    Storage, Handling Precautions, & Disposal     ? Store this medication in the original container at room temperature.  ? Store in  a dry place. Do not store in a bathroom.  ? Keep all drugs in a safe place. Keep all drugs out of the reach of children and pets.  ? Disposal: You should NOT have any Epclusa left over to throw away      Current Medications (including OTC/herbals), Comorbidities and Allergies     Current Outpatient Medications   Medication Sig Dispense Refill   ??? acetaminophen (TYLENOL) 325 MG tablet Take 650 mg by mouth every four (4) hours as needed. ??? cholecalciferol, vitamin D3, 1,000 unit capsule Take 1 capsule by mouth daily.      ??? geranium oil, bulk, 100 % Oil GERANIUM AND CLARYSAGE.  Place in capsule and take once to twice a day.     ??? hydroCHLOROthiazide (HYDRODIURIL) 25 MG tablet Take 1 tablet (25 mg total) by mouth daily. 90 tablet 3   ??? ibuprofen (ADVIL,MOTRIN) 200 MG tablet Take 200 mg by mouth daily as needed.     ??? ketoconazole (NIZORAL) 2 % cream APPLY CREAM TOPICALLY TO AFFECTED AREA ONCE DAILY FOR 6 WEEKS  0   ??? lavender oil Oil LAVENDER AND YLANG YLANG. Apply to bottom of feet once a day prn     ??? lidocaine-prilocaine (EMLA) 2.5-2.5 % cream Apply to [facility] site prior to access 5 g 0   ??? losartan (COZAAR) 50 MG tablet Take 1 tablet (50 mg total) by mouth daily. 90 tablet 3   ??? multivitamin (MULTIVITAMIN) per tablet Take 1 tablet by mouth daily.     ??? NON FORMULARY TUMERIC.  1 dropper per day.     ??? NON FORMULARY LEMON GRASS ESSENTIAL OILS.  Once a day.     ??? NON FORMULARY FRANKINCENSE.  2 drops under tongue three times daily.     ??? polyethylene glycol (MIRALAX) 17 gram packet Take 17 g by mouth daily as needed.     ??? senna-docusate (PERICOLACE) 8.6-50 mg Take 1 tablet by mouth daily as needed.      ??? sofosbuvir-velpatasvir (EPCLUSA) 400-100 mg tablet Take 1 tablet by mouth daily. 28 tablet 2   ??? vitamin E 100 unit 100 UNIT capsule Take 1 capsule by mouth continuous as needed.       No current facility-administered medications for this visit.        Allergies   Allergen Reactions   ??? Adhesive Itching and Swelling     Other reaction(s): Redness       Patient Active Problem List   Diagnosis   ??? Bladder cancer (CMS-HCC)   ??? Urothelial cancer (CMS-HCC)   ??? 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 Essential hypertension   ??? Chronic hepatitis C without hepatic coma (CMS-HCC)   ??? Headache   ??? Nodule of finger of right hand       Reviewed and up to date in Epic.    Appropriateness of Therapy Is medication and dose appropriate based on diagnosis? Yes    Baseline Quality of Life Assessment      How many days over the past month did your Hepatits C keep you from your normal activities? 0    Financial Information     Medication Assistance provided: Prior Authorization    Anticipated copay of $3.64 reviewed with patient. Verified delivery address.    Delivery Information     Scheduled delivery date: 02/26/2019    Expected start date: 02/27/2019    Medication will be delivered via UPS to the  prescription address in [place].  This shipment will not require a signature.      Explained the services we provide at [facility] and that each month we would call to set up refills.  Stressed importance of returning phone calls so that we could ensure they receive their medications in time each month.  Informed patient that we should be setting up refills 7-10 days prior to when they will run out of medication.  A pharmacist will reach out to perform a clinical assessment periodically.  Informed patient that a welcome packet and a drug information handout will be sent.      Patient verbalized understanding of the above information as well as how to contact the pharmacy at [phone] option 4 with any questions/concerns.  The pharmacy is open Monday through Friday 8:30am-4:30pm.  A pharmacist is available 24/7 via pager to answer any clinical questions they may have.    Patient Specific Needs     ? Does the patient have any physical, cognitive, or cultural barriers? No    ? Patient prefers to have medications discussed with  Patient     ? Is the patient able to read and understand education materials at a high school level or above? Yes    ? Patient's primary language is  English     ? Is the patient high risk? No     ? Does the patient require a Care Management Plan? No ? Does the patient require physician intervention or other additional services (i.e. nutrition, smoking cessation, social work)? No      Roderic Palau  [facility] Specialty Pharmacist

## 2019-02-25 MED FILL — SOFOSBUVIR 400 MG-VELPATASVIR 100 MG TABLET: 28 days supply | Qty: 28 | Fill #0 | Status: AC

## 2019-03-11 DIAGNOSIS — C679 Malignant neoplasm of bladder, unspecified: Principal | ICD-10-CM

## 2019-03-11 DIAGNOSIS — C689 Malignant neoplasm of urinary organ, unspecified: Principal | ICD-10-CM

## 2019-03-12 ENCOUNTER — Encounter: Admit: 2019-03-12 | Discharge: 2019-03-13 | Payer: MEDICARE

## 2019-03-12 DIAGNOSIS — C679 Malignant neoplasm of bladder, unspecified: Principal | ICD-10-CM

## 2019-03-12 LAB — CBC W/ AUTO DIFF
BASOPHILS ABSOLUTE COUNT: 0.1 10*9/L (ref 0.0–0.1)
BASOPHILS RELATIVE PERCENT: 1.2 %
EOSINOPHILS ABSOLUTE COUNT: 0.1 10*9/L (ref 0.0–0.7)
EOSINOPHILS RELATIVE PERCENT: 1.3 %
HEMATOCRIT: 39 % (ref 35.0–44.0)
LYMPHOCYTES ABSOLUTE COUNT: 1.7 10*9/L (ref 0.7–4.0)
LYMPHOCYTES RELATIVE PERCENT: 35.2 %
MEAN CORPUSCULAR HEMOGLOBIN CONC: 32.7 g/dL (ref 30.0–36.0)
MEAN CORPUSCULAR HEMOGLOBIN: 26.3 pg (ref 26.0–34.0)
MEAN PLATELET VOLUME: 7.8 fL (ref 7.0–10.0)
MONOCYTES ABSOLUTE COUNT: 0.4 10*9/L (ref 0.1–1.0)
MONOCYTES RELATIVE PERCENT: 8.5 %
NEUTROPHILS ABSOLUTE COUNT: 2.6 10*9/L (ref 1.7–7.7)
NEUTROPHILS RELATIVE PERCENT: 53.8 %
PLATELET COUNT: 221 10*9/L (ref 150–450)
RED BLOOD CELL COUNT: 4.85 10*12/L (ref 3.90–5.03)
RED CELL DISTRIBUTION WIDTH: 14.6 % (ref 12.0–15.0)
WBC ADJUSTED: 4.9 10*9/L (ref 3.5–10.5)

## 2019-03-12 LAB — COMPREHENSIVE METABOLIC PANEL
ALBUMIN: 4.2 g/dL (ref 3.5–5.0)
ALKALINE PHOSPHATASE: 90 U/L (ref 38–126)
ALT (SGPT): 38 U/L — ABNORMAL HIGH (ref <35)
ANION GAP: 8 mmol/L (ref 7–15)
AST (SGOT): 37 U/L (ref 14–38)
BILIRUBIN TOTAL: 0.9 mg/dL (ref 0.0–1.2)
BLOOD UREA NITROGEN: 16 mg/dL (ref 7–21)
BUN / CREAT RATIO: 19
CALCIUM: 9.5 mg/dL (ref 8.5–10.2)
CHLORIDE: 106 mmol/L (ref 98–107)
CREATININE: 0.83 mg/dL (ref 0.60–1.00)
EGFR CKD-EPI AA FEMALE: 86 mL/min/{1.73_m2} (ref >=60)
EGFR CKD-EPI NON-AA FEMALE: 74 mL/min/{1.73_m2} (ref >=60)
GLUCOSE RANDOM: 97 mg/dL (ref 70–179)
POTASSIUM: 3.9 mmol/L (ref 3.5–5.0)
PROTEIN TOTAL: 7.3 g/dL (ref 6.5–8.3)
SODIUM: 145 mmol/L (ref 135–145)

## 2019-03-12 LAB — EOSINOPHILS RELATIVE PERCENT: Eosinophils/100 leukocytes:NFr:Pt:Bld:Qn:Automated count: 1.3

## 2019-03-12 LAB — ALBUMIN: Albumin:MCnc:Pt:Ser/Plas:Qn:: 4.2

## 2019-03-12 MED ADMIN — sodium chloride (NS) 0.9 % flush 20 mL: 20 mL | INTRAVENOUS | @ 19:00:00 | Stop: 2019-03-12

## 2019-03-12 MED ADMIN — heparin, porcine (PF) 100 unit/mL injection 500 Units: 500 [IU] | INTRAVENOUS | @ 19:00:00 | Stop: 2019-03-12

## 2019-03-12 MED ADMIN — iohexoL (OMNIPAQUE) 350 mg iodine/mL solution 100 mL: 100 mL | INTRAVENOUS | @ 20:00:00 | Stop: 2019-03-12

## 2019-03-12 NOTE — Unmapped (Signed)
Pt's port accessed - flushed, bld return noted, labs drawn and heplocked. Pt tolerated it well. Pt scheduled for CT this afternoon.

## 2019-03-13 ENCOUNTER — Encounter: Admit: 2019-03-13 | Discharge: 2019-03-14 | Payer: MEDICARE | Attending: Internal Medicine | Primary: Internal Medicine

## 2019-03-13 NOTE — Unmapped (Signed)
Reviewed Meds and Allergies, confirmed pharmacy and informed how to use Doximity. Visit complete and provider is onsite.

## 2019-03-13 NOTE — Unmapped (Signed)
Genitourinary Oncology Follow-Up Note    Visit Conducted via Doximity Video  Person Contacted: Patient  Contact Phone number: [phone]  Phone Outcome: Completed Visit  Other Present Patient Support People: None  Location of Patient: Home in [place]   Location of Provider: [place], [place]    Total time spent with patient: 20 min    I spent 20 minutes on the video visit with the patient. I spent an additional 10 minutes on pre- and post-visit activities.     The patient was physically located in [place] or a state in which I am permitted to provide care. The patient and/or parent/guardian understood that s/he may incur co-pays and cost sharing, and agreed to the telemedicine visit. The visit was reasonable and appropriate under the circumstances given the patient's presentation at the time.    The patient and/or parent/guardian has been advised of the potential risks and limitations of this mode of treatment (including, but not limited to, the absence of in-person examination) and has agreed to be treated using telemedicine. The patient's/patient's family's questions regarding telemedicine have been answered.     If the phone/video visit was completed in an ambulatory setting, the patient and/or parent/guardian has also been advised to contact their provider???s office for worsening conditions, and seek emergency medical treatment and/or call 911 if the patient deems either necessary.    Assessment: 63 y.o.F with metastatic UC w/ small cell features s/p platinum/etoposide x 6 (carbo/etop x 4, cis/etop x 2), s/p bladder/femur XRT 04/2015, then on atezolizumab (04/26/15-05/2017) and left thigh XRT and left para-aortic XRT during treatment for oligoprogressive disease, now with SD after holding atezolizumab in 05/2017.    1. 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 Plan to repeat CT in 3 months. Upon progression, options include re-initiation of immune checkpoint inhibition, gem/cisplatin with split dose cisplatin vs EV vs clinical trial.    2. Elevated TSH: TFTs normalized.    3. Hepatitis C: Now on treatment for her hep C.    4. Headaches/right arm radicular pain, resolved: C-spine/MR brain negative for mets 05/2017.    5. Hypertension: Losartan 50mg  qDay and HCTZ 25mg  daily.    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 She was then lost to f/u and eventually diagnosed with metastatic disease to left arm 10/2014. She was treated with platinum/etoposide with good response after 3 cycles and progression after 6. She underwent XRT to the left arm and left supraclavicular node in late 2016. She also received bladder XRT for palliation of hematuria, as well as femur XRT for pain control, completed 06/2015.    She was initiated on atezolizumab on 04/16/15. During cycle 4 she complained of new right hip pain and had palliative radiation to the right iliac crest and was continued on atezo. She then had worsening left thigh pain during cycle 9 (12/01/15) and CT showed a new left thigh met treated with XRT. She also had slowly progressive RP adenopathy and underwent XRT with subsequent stable disease. She completed 2 years of atezolizumab (last dose 04/30/17) and decided to halt treatment in favor of a surveillance  approach.    Interval History:  She has been doing well since visit. She is now on treatment for her hepatitis C and plans for a 3 month treatment. She does get mild headaches which she thinks is treatment related. 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    Past Medical History:  Active Ambulatory Problems     Diagnosis Date Noted   ??? Bladder cancer (CMS-HCC) 10/12/2014   ??? Urothelial cancer (CMS-HCC) 01/19/2015   ??? 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 Essential hypertension 11/22/2015   ??? Chronic hepatitis C without hepatic coma (CMS-HCC) 01/26/2016   ??? Headache 04/26/2017   ??? Nodule of finger of right hand 04/26/2017     Resolved Ambulatory Problems     Diagnosis Date Noted   ??? No Resolved Ambulatory Problems     Past Medical History:   Diagnosis Date   ??? Abnormal Pap smear of cervix    ??? Extramammary Paget disease    ??? Hepatitis C    ??? Hyperlipidemia    ??? Hypertension    Long history of abnormal pap smears    Current Medications: Reviewed in EPIC     Social History:  Married. Works as a Interior and spatial designer. 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    Family History:  Mother - COPD/lung cancer, Father - MI    Physical Examination:   There were no vitals filed for this visit.  ECOG PS 0    Labs Reviewed in [facility]    Pathology    Surgical Pathology Consult Report  Accession #: [id number]  Diagnosis:  (Outside case, [facility] [phone], 8 slides, 06/13/2013)  1. Bladder, transurethral resection  - Invasive high-grade urothelial cell carcinoma with small cell/neuroendocrine  and glandular differentiation (see comment)  - Lamina propria present: positive for involvement by tumor  - Muscularis propria present: positive for involvement by tumor  - Flat urothelial carcinoma in situ present    2. Bladder, base, biopsy:  - Invasive high-grade urothelial cell carcinoma, with small cell/neuroendocrine  differentiation (see comment)  - Lamina propria present: positive for invasive carcinoma  - Muscularis propria present: positive for invasive carcinoma  Comment:  We share the originating pathologist's concern for invasive high-grade urothelial carcinoma invading the muscularis propria. The tumor is predominantly composed of three morphologies, with the most prevalent consisting of conventional high-grade urothelial pattern, nos. The second is a glandular pattern and the third pattern is composed of malignant cells with increased nuclear:cytoplasm and nuclear molding and clumping chromatin. Immunohistochemical stains were performed at [facility] and these malignant cells stain strongly and diffusely positive with Synaptophysin and CD56 and scattered positivity for chromogranin. The morphology and immunohistochemical profile are consistent with focal small cell/neuroendocrine differentiation. We feel the tumor in specimen A consists predominantly of high-grade urothelial carcinoma, nos (50%), glandular differentiation (40%), and small cell carcinoma (10%). In specimen B, the small cell component is approximately 40% and the high-grade urothelial carcinoma, nos component is 60%.    Foundation Medicine:  RAF1 amplification  CRKL amplification (equivocal)  MED12 [id number]  RB1 Q467fs*23  STAG2 B147*W295  TERT promoter 124C>T  TP53 Q136  Tumor mutation burden - intermediate - 15 muts/Mb    UC-Genome/Caris:  ATM, VUS in Exon 44 - [id number]  ERBB2, VUS in Exon 15 - A213Y  ERCC2 mutation, Presumed Pathogenic, Exon  8 - N238S  RB1 mutation, Exon 13 - Q413fs  TP53, Presumed pathogenic, Exon 5 - Q136X  Total Mutational Load High - 17 Mutations/Mb     Imaging    Outside CT 04/06/2014   1.7 cm right internal iliac LN  1.7 x 1.9 cm  Right common iliac node  2.5 x 2 cm pericaval node  3.3 x 2.3 anterior bladder wall mass with adjacent fat stranding    PET CT 10/16/14:  - Primary malignant mass in the anterior bladder wall with possible second focus in the posterior wall  - Metastatic retroperitoneal and right pelvic adenopathy  - Intramuscular and osseous metastases as above (note that lower extremities are not included in this study)     MRI brain 10/29/14:  No intracranial hemorrhage or acute infarct.  No enhancing lesions.     CT CAP 01/17/15:  1. Mildly decreased size of anterior bladder wall mass.  2. Left axillary lymphadenopathy and left supraclavicular lymph nodes increased or new in size since PET from 10/16/14.  3. Grossly stable retroperitoneal and pelvic lymphadenopathy.  4. New medial right lower lobe opacities likely infectious/inflammatory. Attention on followup to exclude metastatic disease.    CT 04/09/15:  Slight enlargement of a few of the left axillary, supra-clavicular, retroperitoneal, and pelvic lymph nodes with slight interval decrease in the remainder. However, interval worsening of metastatic osseous disease with new metastasis involving the right iliac wing and interval worsening of a proximal left femoral metastasis involving the lesser trochanter. Interval increase in size of anterior bladder wall mass.     CT CAP 08/26/15:  --Near resolution of previously seen bladder mass  --Increased retroperitoneal adenopathy  --Right iliac wing metastatic lesion now appears to be lytic rather than sclerotic. It is uncertain if this represents progression of disease or treatment response.  --Decreased left femoral metastases.  --Additional chronic and incidental findings, as above.    CT CAP 10/29/15:  --Worsened metastatic retroperitoneal lymphadenopathy.  --Slight increase in size and heterogeneity of right iliac wing metastasis.    MRI Thoracic spine 11/16/15:  1. Borderline left foraminal stenosis at T10-11 due to disc  protrusion. Questionable mild left foraminal stenosis at T12-L1 due  to a left inferior disc protrusion.  2. Disc bulges at multiple thoracic levels, without impingement at  those levels.  3. No evidence metastatic disease to the thoracic spine.    CT pelvis 12/02/15:  1. The palpable left medial thigh abnormality corresponds to and intramuscular mass of the left adductor magnus, suspicious for a metastatic lesion.  2. There also are 2 new skeletal lesions, suspicious for hematogenous skeletal metastases.    CT CAP 12/29/15:  -Slightly increased size of the 4.8 x 4.1 cm aortocaval lymph node.  -Unchanged right iliac wing osseous metastasis.  -No new sites of metastatic disease.    CT CAP 02/10/16:  1. Interval treatment response, with decreasing size and soft tissue within a conglomerate of aortocaval lymph nodes. No new or enlarging lymph nodes are seen.  2. Unchanged sclerotic metastases within the right iliac wing and left proximal femur.    CT CAP 04/18/16  1. Decreased size of aortocaval nodal mass  2. Stable osseous lesions  3. Stable punctate non-obstructive right renal calculus    CT CAP 07/24/16 (s/p cycle 19)  --Slight decrease in size of aortocaval retroperitoneal mass.  --Slight increase in size of one hepatic hypodensity, which may be due to slice selection differences.  --No other change.    CT  CAP 10/26/16 (s/p cycle 23)  -- Aortocaval retroperitoneal mass and precaval/peripancreatic adenopathy are similar to prior. Unchanged sclerotic osseous lesions. No CT evidence of new or enlarging metastatic disease.  -- Unchanged subcentimeter hypoattenuating hepatic lesions, indeterminate and too small to characterize. Attention on follow-up.    CT CAP 01/18/17 (s/p cycle 26)  Unchanged aortocaval retroperitoneal soft tissue mass and portacaval lymphadenopathy.  Unchanged sclerotic osseous lesions.   Unchanged tiny right upper lobe pulmonary nodule, indeterminate.  Previously described subcentimeter hypoattenuating hepatic lesions ??remain too small to characterize.    CT CAP 04/26/17 (s/p C30)  -- Unchanged subcentimeter right upper lobe pulmonary nodules measuring up to 0.3 cm. No new pulmonary nodules.  -- Aortocaval irregular soft tissue and portacaval lymphadenopathy, unchanged.  -- Sclerotic lesions involving the right iliac wing and left lesser trochanter, unchanged.   -- Previously visualized subcentimeter hepatic hypodensity is not seen on the current study.  -- No new sites of disease.    Hand xray 04/26/17:  Right hand: Well corticated osseous fragment just medial to the second PIP joint which may reflect sequelae of prior trauma. Overlying soft tissues swelling/nodularity.     CT CAP 06/28/17:  - Stable exam without new or progressive interval findings.  - Previously described right upper lobe pulmonary nodules are unchanged.  Aortocaval irregular soft tissue, unchanged.  Unchanged sclerotic lesions involving the right iliac wing and left lesser trochanter.  Unchanged subcentimeter hypodensity in hepatic segment 5, too small to characterize on CT.  Interval mild enlargement of the left inguinal lymph node compared to prior which could be reactive. Attention on follow-up.  Otherwise, no new sites of metastatic disease in the abdomen or pelvis.    CT CAP 08/30/17  - Stable tiny right upper lobe pulmonary nodules. No new nodules.  - Unchanged irregular aortocaval soft tissue and sclerotic lesions in the right iliac wing and left lesser trochanter.  - No other definite evidence of metastatic disease in the abdomen or pelvis.    CT CAP 03/12/18  - No thoracic metastatic disease.   - Unchanged sclerotic lesions in the right iliac wing and left lesser trochanter.  - Stable ill-defined soft tissue in the portacaval region just inferior to the left renal vein.  - Prominent 1.2 cm left inguinal lymph node is partially visualized but appears increased in size compared to prior but stable compared to 06/28/2017 and could be reactive.   - No new sites of metastatic disease in the abdomen or pelvis.  - See same-day separately dictated CT chest for evaluation of findings above the diaphragm.     CT CAP 06/11/18  - No thoracic metastatic disease.  - Irregular sclerosis within the right iliac wing and left trochanter, stable.  - Similar intermediate attenuation soft tissue in the aortocaval region below the left renal vein. This blends with unopacified IVC making exact measurements difficult.  - No new sites of metastatic disease within the abdomen/pelvis.    CT CAP 09/17/18 (compared to 06/11/2018 scans):  -No evidence of new thoracic metastasis  -Unchanged sclerotic lesions in the right iliac wing and left lesser trochanter.  -Stable ill-defined soft tissue in the aortocaval region just inferior to the left renal vein.  -No new sites of metastatic disease in the abdomen or pelvis.    CT CAP 03/12/19  -No evidence of thoracic metastasis.  -Unchanged sclerotic lesions in the right iliac wing and left lesser trochanter.  -Unchanged ill-defined soft tissue in the aortocaval region just inferior to the  left renal vein.  -No new sites of metastatic disease in the abdomen or pelvis.  -For findings above the diaphragm, please refer to concurrent same-day CT chest.

## 2019-03-13 NOTE — Unmapped (Signed)
It was a pleasure to see you in clinic today.    Contact information:    Nurse Navigator: Joaquin Bend  Phone: [phone]  For scheduling needs or questions Monday through Friday 8AM-5PM, please contact [phone] or Toll free [phone]  For urgent needs on nights and weekends, call [phone] and ask for the oncology fellow on call.    Please visit Inrails.de, a resource created just for family members and caregivers. This website lists support services, how and where to ask for help. It has tools to assist you as you help [place] care for your loved one.    [facility]  [address]  [place], [place] [address]  www.[email or web address]

## 2019-03-17 NOTE — Unmapped (Signed)
I spoke with patient Vanessa Montoya to confirm appointments on the following date(s): 06/13/19 for labs and CT in [facility], and f/u with Dr. Okey Dupre on 06/16/19 at 8:30am.    [name]

## 2019-03-18 NOTE — Unmapped (Signed)
[facility] Clinical Assessment & Refill Coordination Note    Vanessa Montoya, DOB: July 20, 1955  Phone: [phone] (home)     All above HIPAA information was verified with patient.     Was a Nurse, learning disability used for this call? No    Specialty Medication(s):   Infectious Disease: sofosbuvir/velpatasvir     Current Outpatient Medications   Medication Sig Dispense Refill   ??? acetaminophen (TYLENOL) 325 MG tablet Take 650 mg by mouth every four (4) hours as needed.      ??? ascorbic acid (VITAMIN C ORAL) Take by mouth.     ??? cholecalciferol, vitamin D3, 1,000 unit capsule Take 1 capsule by mouth daily.      ??? geranium oil, bulk, 100 % Oil GERANIUM AND CLARYSAGE.  Place in capsule and take once to twice a day.     ??? hydroCHLOROthiazide (HYDRODIURIL) 25 MG tablet Take 1 tablet (25 mg total) by mouth daily. 90 tablet 3   ??? ibuprofen (ADVIL,MOTRIN) 200 MG tablet Take 200 mg by mouth daily as needed.     ??? ketoconazole (NIZORAL) 2 % cream APPLY CREAM TOPICALLY TO AFFECTED AREA ONCE DAILY FOR 6 WEEKS  0   ??? lavender oil Oil LAVENDER AND YLANG YLANG. Apply to bottom of feet once a day prn     ??? lidocaine-prilocaine (EMLA) 2.5-2.5 % cream Apply to [facility] site prior to access 5 g 0   ??? losartan (COZAAR) 50 MG tablet Take 1 tablet (50 mg total) by mouth daily. 90 tablet 3   ??? multivitamin (MULTIVITAMIN) per tablet Take 1 tablet by mouth daily.     ??? NON FORMULARY TUMERIC.  1 dropper per day.     ??? NON FORMULARY LEMON GRASS ESSENTIAL OILS.  Once a day.     ??? NON FORMULARY FRANKINCENSE.  2 drops under tongue three times daily.     ??? polyethylene glycol (MIRALAX) 17 gram packet Take 17 g by mouth daily as needed.     ??? senna-docusate (PERICOLACE) 8.6-50 mg Take 1 tablet by mouth daily as needed.      ??? sofosbuvir-velpatasvir (EPCLUSA) 400-100 mg tablet Take 1 tablet by mouth daily. 28 tablet 2   ??? vitamin E 100 unit 100 UNIT capsule Take 1 capsule by mouth continuous as needed. ??? ZINC ORAL Take by mouth.       No current facility-administered medications for this visit.         Changes to medications: Shena reports no changes at this time.    Allergies   Allergen Reactions   ??? Adhesive Itching and Swelling     Other reaction(s): Redness       Changes to allergies: No    SPECIALTY MEDICATION ADHERENCE     sofosbuvir-velpatasvir 400-100 mg: 11 days of medicine on hand after today        Specialty medication(s) dose(s) confirmed: Regimen is correct and unchanged.     Are there any concerns with adherence? No    Adherence counseling provided? Not needed    CLINICAL MANAGEMENT AND INTERVENTION      Clinical Benefit Assessment:    Do you feel the medicine is effective or helping your condition? Yes. She feels much more energetic now    Clinical Benefit counseling provided? Not needed    Adverse Effects Assessment:    Are you experiencing any side effects? Patient had a mild headache in the beginning but it is much better now.  Are you experiencing difficulty administering your medicine? No . She has been crushing the tablet with apple sauce on 2 spoons and makes sure she consumes the entire amount of apple sauce/drug mixture.    Quality of Life Assessment:    How many days over the past month did your Hepatitis C  keep you from your normal activities? For example, brushing your teeth or getting up in the morning. 0    Have you discussed this with your provider? Not needed    Therapy Appropriateness:    Is therapy appropriate? Yes, therapy is appropriate and should be continued    DISEASE/MEDICATION-SPECIFIC INFORMATION      For Hepatitis C patients:  Regimen: sofosbuvir-velpatasvir 400-100mg  x 12 weeks  Therapy start date: 03/02/2019  Completed Treatment Week #: 2  What time of day do you take your medicine? afternoon at around 2:30  Pill count over the phone revealed #11 tablets which is appropriate. Are you taking any new OTC or herbal medication? No. I also told her to avoid rubbing any essential oils like frankincense or lavendar on her skin.  I told her it is ok to take her zinc and vitamin C supplements without spacing away from the sofobuvir/velpatasvir.  Any alcoholic beverages? No  Do you have a follow up appointment? Yes, appointment is scheduled, patient is aware, and no identified barriers    PATIENT SPECIFIC NEEDS     ? Does the patient have any physical, cognitive, or cultural barriers? No    ? Is the patient high risk? No     ? Does the patient require a Care Management Plan? No     ? Does the patient require physician intervention or other additional services (i.e. nutrition, smoking cessation, social work)? No      SHIPPING     Specialty Medication(s) to be Shipped:   Infectious Disease: sofosbuvir/velpatasvir    Other medication(s) to be shipped: n/a     Changes to insurance: No    Delivery Scheduled: Yes, Expected medication delivery date: 03/20/2019.     Medication will be delivered via UPS to the confirmed prescription address in [facility].    The patient will receive a drug information handout for each medication shipped and additional FDA Medication Guides as required.  Verified that patient has previously received a Conservation officer, historic buildings.    All of the patient's questions and concerns have been addressed.    Roderic Palau   [facility] Specialty Pharmacist

## 2019-03-19 MED FILL — SOFOSBUVIR 400 MG-VELPATASVIR 100 MG TABLET: ORAL | 28 days supply | Qty: 28 | Fill #1

## 2019-03-19 MED FILL — SOFOSBUVIR 400 MG-VELPATASVIR 100 MG TABLET: 28 days supply | Qty: 28 | Fill #1 | Status: AC

## 2019-04-07 NOTE — Unmapped (Signed)
[facility] Refill Coordination Note    Specialty Medication(s) to be Shipped:   Infectious Disease: sofosbuvir/velpatasvir    Other medication(s) to be shipped: n/a     Vanessa Montoya, DOB: 08-18-1955  Phone: [phone] (home)       All above HIPAA information was verified with patient.     Was a Nurse, learning disability used for this call? No    Completed refill call assessment today to schedule patient's medication shipment from the [facility] [phone]).       Specialty medication(s) and dose(s) confirmed: Regimen is correct and unchanged.   Changes to medications: [place] reports no changes at this time.  Changes to insurance: No  Questions for the pharmacist: No    Confirmed patient received Welcome Packet with first shipment. The patient will receive a drug information handout for each medication shipped and additional FDA Medication Guides as required.       DISEASE/MEDICATION-SPECIFIC INFORMATION        For Hepatitis C patients:  Regimen: sofosbuvir-velpatasvir 400-100mg  x 12 weeks  Therapy start date: 03/02/2019  Are you taking any new OTC or herbal medication? No  Do you have a follow up appointment? Yes, appointment is scheduled, patient is aware, and no identified barriers    SPECIALTY MEDICATION ADHERENCE     Medication Adherence    Patient reported X missed doses in the last month: 0  Specialty Medication: sofosbuvir-velpatasvir 400-100mg             Patient is unable to confirm actual quantity on hand of medication. I asked patient to call back with exact pill count if possible          SHIPPING     Shipping address confirmed in Epic.     Delivery Scheduled: Yes, Expected medication delivery date: 1/14.     Medication will be delivered via UPS to the prescription address in Epic [facility].    Westley Gambles   [facility] Specialty Technician

## 2019-04-08 DIAGNOSIS — B182 Chronic viral hepatitis C: Principal | ICD-10-CM

## 2019-04-08 NOTE — Unmapped (Signed)
Labs only  Treatment Week 4 HCV RNA and CMP

## 2019-04-09 DIAGNOSIS — B182 Chronic viral hepatitis C: Principal | ICD-10-CM

## 2019-04-09 NOTE — Unmapped (Signed)
-----   Message from [facility] [place], [place] sent at 04/09/2019  1:34 PM EST -----  Regarding: Port Blood Draw  Patient cannot get blood drawn at [facility], she has no veins    Can you get her set up for a port-a-cath blood draw at [facility] cancer center.  She is a patient there., She can go any day except Wed.      Vanessa Montoya

## 2019-04-09 NOTE — Unmapped (Signed)
This patient has been disenrolled from the [facility] specialty pharmacy services due to therapy completion - expected therapy completion date: 05/24/19. Last shipment will be delivered on 04/17/19.    Roderic Palau  [facility] Specialty Pharmacist

## 2019-04-11 ENCOUNTER — Non-Acute Institutional Stay: Admit: 2019-04-11 | Discharge: 2019-04-11 | Payer: MEDICARE

## 2019-04-11 ENCOUNTER — Institutional Professional Consult (permissible substitution): Admit: 2019-04-11 | Discharge: 2019-04-11 | Payer: MEDICARE

## 2019-04-15 ENCOUNTER — Ambulatory Visit: Admit: 2019-04-15 | Discharge: 2019-04-16 | Payer: MEDICARE

## 2019-04-15 DIAGNOSIS — C679 Malignant neoplasm of bladder, unspecified: Principal | ICD-10-CM

## 2019-04-16 MED FILL — SOFOSBUVIR 400 MG-VELPATASVIR 100 MG TABLET: 28 days supply | Qty: 28 | Fill #2 | Status: AC

## 2019-04-16 MED FILL — SOFOSBUVIR 400 MG-VELPATASVIR 100 MG TABLET: ORAL | 28 days supply | Qty: 28 | Fill #2

## 2019-06-05 ENCOUNTER — Encounter: Admit: 2019-06-05 | Discharge: 2019-06-06 | Payer: MEDICARE

## 2019-06-12 DIAGNOSIS — C778 Secondary and unspecified malignant neoplasm of lymph nodes of multiple regions: Principal | ICD-10-CM

## 2019-06-13 ENCOUNTER — Ambulatory Visit: Admit: 2019-06-13 | Discharge: 2019-06-13 | Payer: MEDICARE

## 2019-06-13 ENCOUNTER — Encounter: Admit: 2019-06-13 | Discharge: 2019-06-13 | Payer: MEDICARE

## 2019-06-13 DIAGNOSIS — C778 Secondary and unspecified malignant neoplasm of lymph nodes of multiple regions: Principal | ICD-10-CM

## 2019-06-16 ENCOUNTER — Encounter: Admit: 2019-06-16 | Discharge: 2019-06-17 | Payer: MEDICARE | Attending: Internal Medicine | Primary: Internal Medicine

## 2019-06-16 DIAGNOSIS — C778 Secondary and unspecified malignant neoplasm of lymph nodes of multiple regions: Principal | ICD-10-CM

## 2019-06-19 DIAGNOSIS — D8989 Other specified disorders involving the immune mechanism, not elsewhere classified: Principal | ICD-10-CM

## 2019-06-19 DIAGNOSIS — C679 Malignant neoplasm of bladder, unspecified: Principal | ICD-10-CM

## 2019-07-02 DIAGNOSIS — I10 Essential (primary) hypertension: Principal | ICD-10-CM

## 2019-07-03 MED ORDER — LOSARTAN 50 MG TABLET
ORAL_TABLET | 11 refills | 0 days | Status: CP
Start: 2019-07-03 — End: ?

## 2019-07-03 MED ORDER — HYDROCHLOROTHIAZIDE 25 MG TABLET
ORAL_TABLET | 11 refills | 0 days | Status: CP
Start: 2019-07-03 — End: ?

## 2019-08-19 DIAGNOSIS — B182 Chronic viral hepatitis C: Principal | ICD-10-CM

## 2019-09-05 DIAGNOSIS — C778 Secondary and unspecified malignant neoplasm of lymph nodes of multiple regions: Principal | ICD-10-CM

## 2019-09-08 ENCOUNTER — Ambulatory Visit: Admit: 2019-09-08 | Discharge: 2019-09-08 | Payer: MEDICARE

## 2019-09-08 ENCOUNTER — Institutional Professional Consult (permissible substitution): Admit: 2019-09-08 | Discharge: 2019-09-08 | Payer: MEDICARE

## 2019-09-08 DIAGNOSIS — B182 Chronic viral hepatitis C: Principal | ICD-10-CM

## 2019-09-08 DIAGNOSIS — C679 Malignant neoplasm of bladder, unspecified: Principal | ICD-10-CM

## 2019-09-08 DIAGNOSIS — D8989 Other specified disorders involving the immune mechanism, not elsewhere classified: Principal | ICD-10-CM

## 2019-09-15 ENCOUNTER — Telehealth: Admit: 2019-09-15 | Discharge: 2019-09-16 | Payer: MEDICARE | Attending: Internal Medicine | Primary: Internal Medicine

## 2019-12-16 ENCOUNTER — Institutional Professional Consult (permissible substitution): Admit: 2019-12-16 | Discharge: 2019-12-16 | Payer: MEDICARE

## 2019-12-16 DIAGNOSIS — C778 Secondary and unspecified malignant neoplasm of lymph nodes of multiple regions: Principal | ICD-10-CM

## 2019-12-16 DIAGNOSIS — C679 Malignant neoplasm of bladder, unspecified: Principal | ICD-10-CM

## 2020-03-14 DIAGNOSIS — C778 Secondary and unspecified malignant neoplasm of lymph nodes of multiple regions: Principal | ICD-10-CM

## 2020-03-23 ENCOUNTER — Encounter: Admit: 2020-03-23 | Discharge: 2020-03-24 | Payer: MEDICARE

## 2020-03-23 DIAGNOSIS — R9389 Abnormal findings on diagnostic imaging of other specified body structures: Principal | ICD-10-CM

## 2020-03-23 DIAGNOSIS — C778 Secondary and unspecified malignant neoplasm of lymph nodes of multiple regions: Principal | ICD-10-CM

## 2020-03-23 DIAGNOSIS — C679 Malignant neoplasm of bladder, unspecified: Principal | ICD-10-CM

## 2020-03-29 ENCOUNTER — Encounter: Admit: 2020-03-29 | Discharge: 2020-03-30 | Payer: MEDICARE | Attending: Internal Medicine | Primary: Internal Medicine

## 2020-03-29 DIAGNOSIS — C778 Secondary and unspecified malignant neoplasm of lymph nodes of multiple regions: Principal | ICD-10-CM

## 2020-03-30 DIAGNOSIS — R2231 Localized swelling, mass and lump, right upper limb: Principal | ICD-10-CM

## 2020-04-12 ENCOUNTER — Other Ambulatory Visit: Payer: Medicare HMO

## 2020-04-12 DIAGNOSIS — Z20822 Contact with and (suspected) exposure to covid-19: Secondary | ICD-10-CM

## 2020-04-15 LAB — NOVEL CORONAVIRUS, NAA: SARS-CoV-2, NAA: NOT DETECTED

## 2020-04-15 LAB — SARS-COV-2, NAA 2 DAY TAT

## 2020-08-12 DIAGNOSIS — C679 Malignant neoplasm of bladder, unspecified: Principal | ICD-10-CM

## 2020-08-13 ENCOUNTER — Other Ambulatory Visit: Payer: Self-pay | Admitting: Rheumatology

## 2020-08-13 DIAGNOSIS — M8568 Other cyst of bone, other site: Secondary | ICD-10-CM

## 2020-08-13 DIAGNOSIS — M899 Disorder of bone, unspecified: Secondary | ICD-10-CM

## 2020-08-20 ENCOUNTER — Ambulatory Visit
Admission: RE | Admit: 2020-08-20 | Discharge: 2020-08-20 | Disposition: A | Discharge status: Home / Self Care | Payer: Medicare Other | Source: Ambulatory Visit | Attending: Rheumatology | Admitting: Rheumatology

## 2020-08-20 DIAGNOSIS — M899 Disorder of bone, unspecified: Secondary | ICD-10-CM

## 2020-08-20 DIAGNOSIS — M8568 Other cyst of bone, other site: Secondary | ICD-10-CM

## 2020-08-23 ENCOUNTER — Encounter: Admit: 2020-08-23 | Discharge: 2020-08-23 | Payer: MEDICARE

## 2020-08-26 ENCOUNTER — Ambulatory Visit: Admit: 2020-08-26 | Discharge: 2020-08-27 | Payer: MEDICARE | Attending: Internal Medicine | Primary: Internal Medicine

## 2020-08-26 ENCOUNTER — Encounter: Admit: 2020-08-26 | Discharge: 2020-08-27 | Payer: MEDICARE

## 2020-08-26 DIAGNOSIS — C778 Secondary and unspecified malignant neoplasm of lymph nodes of multiple regions: Principal | ICD-10-CM

## 2020-08-26 DIAGNOSIS — C679 Malignant neoplasm of bladder, unspecified: Principal | ICD-10-CM

## 2020-08-27 DIAGNOSIS — C679 Malignant neoplasm of bladder, unspecified: Principal | ICD-10-CM

## 2020-09-10 ENCOUNTER — Ambulatory Visit: Admit: 2020-09-10 | Discharge: 2020-09-10 | Payer: MEDICARE

## 2020-09-10 DIAGNOSIS — C679 Malignant neoplasm of bladder, unspecified: Principal | ICD-10-CM

## 2020-09-18 DIAGNOSIS — I10 Essential (primary) hypertension: Principal | ICD-10-CM

## 2020-09-18 MED ORDER — HYDROCHLOROTHIAZIDE 25 MG TABLET
ORAL_TABLET | 0 refills | 0 days
Start: 2020-09-18 — End: ?

## 2020-09-18 MED ORDER — LOSARTAN 50 MG TABLET
ORAL_TABLET | 0 refills | 0 days
Start: 2020-09-18 — End: ?

## 2020-09-20 MED ORDER — HYDROCHLOROTHIAZIDE 25 MG TABLET
ORAL_TABLET | 3 refills | 0 days | Status: CP
Start: 2020-09-20 — End: ?

## 2020-09-20 MED ORDER — LOSARTAN 50 MG TABLET
ORAL_TABLET | 3 refills | 0 days | Status: CP
Start: 2020-09-20 — End: ?

## 2021-03-01 ENCOUNTER — Ambulatory Visit: Admit: 2021-03-01 | Discharge: 2021-03-01 | Payer: MEDICARE

## 2021-03-03 ENCOUNTER — Other Ambulatory Visit: Admit: 2021-03-03 | Discharge: 2021-03-04 | Payer: MEDICARE

## 2021-03-03 ENCOUNTER — Ambulatory Visit: Admit: 2021-03-03 | Discharge: 2021-03-04 | Payer: MEDICARE | Attending: Internal Medicine | Primary: Internal Medicine

## 2021-03-03 DIAGNOSIS — C778 Secondary and unspecified malignant neoplasm of lymph nodes of multiple regions: Principal | ICD-10-CM

## 2021-06-27 MED ORDER — LOSARTAN 25 MG TABLET
ORAL_TABLET | Freq: Every day | ORAL | 3 refills | 90 days | Status: CP
Start: 2021-06-27 — End: ?

## 2021-08-30 ENCOUNTER — Ambulatory Visit: Admit: 2021-08-30 | Discharge: 2021-08-31 | Payer: MEDICARE

## 2021-08-31 ENCOUNTER — Ambulatory Visit: Admit: 2021-08-31 | Discharge: 2021-09-01 | Payer: MEDICARE

## 2021-09-01 ENCOUNTER — Ambulatory Visit: Admit: 2021-09-01 | Discharge: 2021-09-02 | Payer: MEDICARE | Attending: Internal Medicine | Primary: Internal Medicine

## 2021-09-01 DIAGNOSIS — C778 Secondary and unspecified malignant neoplasm of lymph nodes of multiple regions: Principal | ICD-10-CM

## 2021-12-12 DIAGNOSIS — I10 Essential (primary) hypertension: Principal | ICD-10-CM

## 2021-12-12 MED ORDER — HYDROCHLOROTHIAZIDE 25 MG TABLET
ORAL_TABLET | 0 refills | 0 days
Start: 2021-12-12 — End: ?

## 2021-12-13 MED ORDER — HYDROCHLOROTHIAZIDE 25 MG TABLET
ORAL_TABLET | 0 refills | 0 days | Status: CP
Start: 2021-12-13 — End: ?

## 2022-03-14 ENCOUNTER — Ambulatory Visit: Admit: 2022-03-14 | Discharge: 2022-03-14 | Payer: MEDICARE

## 2022-03-14 DIAGNOSIS — C778 Secondary and unspecified malignant neoplasm of lymph nodes of multiple regions: Principal | ICD-10-CM

## 2022-03-16 ENCOUNTER — Ambulatory Visit: Admit: 2022-03-16 | Discharge: 2022-03-17 | Payer: MEDICARE | Attending: Internal Medicine | Primary: Internal Medicine

## 2022-03-16 DIAGNOSIS — R300 Dysuria: Principal | ICD-10-CM

## 2022-03-16 DIAGNOSIS — C778 Secondary and unspecified malignant neoplasm of lymph nodes of multiple regions: Principal | ICD-10-CM

## 2022-09-25 ENCOUNTER — Ambulatory Visit: Admit: 2022-09-25 | Discharge: 2022-09-26 | Payer: MEDICARE | Attending: Internal Medicine | Primary: Internal Medicine

## 2022-09-25 ENCOUNTER — Other Ambulatory Visit: Admit: 2022-09-25 | Discharge: 2022-09-26 | Payer: MEDICARE

## 2022-09-25 ENCOUNTER — Ambulatory Visit: Admit: 2022-09-25 | Discharge: 2022-09-26 | Payer: MEDICARE

## 2022-09-25 DIAGNOSIS — C689 Malignant neoplasm of urinary organ, unspecified: Principal | ICD-10-CM

## 2022-09-25 DIAGNOSIS — C679 Malignant neoplasm of bladder, unspecified: Principal | ICD-10-CM

## 2022-09-25 DIAGNOSIS — Z79899 Other long term (current) drug therapy: Principal | ICD-10-CM

## 2022-09-25 DIAGNOSIS — C778 Secondary and unspecified malignant neoplasm of lymph nodes of multiple regions: Principal | ICD-10-CM

## 2023-03-13 DIAGNOSIS — C689 Malignant neoplasm of urinary organ, unspecified: Principal | ICD-10-CM

## 2023-03-15 ENCOUNTER — Ambulatory Visit: Admit: 2023-03-15 | Discharge: 2023-03-15 | Payer: MEDICARE | Attending: Internal Medicine | Primary: Internal Medicine

## 2023-03-15 ENCOUNTER — Ambulatory Visit: Admit: 2023-03-15 | Discharge: 2023-03-15 | Payer: MEDICARE

## 2023-03-15 ENCOUNTER — Other Ambulatory Visit: Admit: 2023-03-15 | Discharge: 2023-03-15 | Payer: MEDICARE

## 2023-03-15 DIAGNOSIS — C778 Secondary and unspecified malignant neoplasm of lymph nodes of multiple regions: Principal | ICD-10-CM

## 2023-03-16 DIAGNOSIS — C689 Malignant neoplasm of urinary organ, unspecified: Principal | ICD-10-CM

## 2023-03-16 DIAGNOSIS — E0590 Thyrotoxicosis, unspecified without thyrotoxic crisis or storm: Principal | ICD-10-CM

## 2023-03-21 ENCOUNTER — Other Ambulatory Visit (HOSPITAL_COMMUNITY): Payer: Self-pay | Admitting: Internal Medicine

## 2023-03-21 DIAGNOSIS — C689 Malignant neoplasm of urinary organ, unspecified: Secondary | ICD-10-CM

## 2023-03-27 DIAGNOSIS — C689 Malignant neoplasm of urinary organ, unspecified: Principal | ICD-10-CM

## 2023-03-29 DIAGNOSIS — C689 Malignant neoplasm of urinary organ, unspecified: Principal | ICD-10-CM

## 2023-03-30 ENCOUNTER — Encounter (HOSPITAL_COMMUNITY)
Admission: RE | Admit: 2023-03-30 | Discharge: 2023-03-30 | Disposition: A | Discharge status: Home / Self Care | Payer: 59 | Source: Ambulatory Visit | Attending: Internal Medicine | Admitting: Internal Medicine

## 2023-03-30 DIAGNOSIS — C689 Malignant neoplasm of urinary organ, unspecified: Secondary | ICD-10-CM | POA: Insufficient documentation

## 2023-03-30 MED ORDER — TECHNETIUM TC 99M MEDRONATE IV KIT
22.0000 | PACK | Freq: Once | INTRAVENOUS | Status: AC | PRN
  Administered 2023-03-30: 22 via INTRAVENOUS

## 2023-04-02 ENCOUNTER — Inpatient Hospital Stay: Admit: 2023-04-02 | Discharge: 2023-04-03 | Payer: MEDICARE

## 2023-04-20 DIAGNOSIS — C689 Malignant neoplasm of urinary organ, unspecified: Principal | ICD-10-CM

## 2023-04-20 DIAGNOSIS — Z79899 Other long term (current) drug therapy: Principal | ICD-10-CM

## 2023-04-20 DIAGNOSIS — E0590 Thyrotoxicosis, unspecified without thyrotoxic crisis or storm: Principal | ICD-10-CM

## 2023-09-12 DIAGNOSIS — C689 Malignant neoplasm of urinary organ, unspecified: Principal | ICD-10-CM

## 2023-09-13 ENCOUNTER — Other Ambulatory Visit: Admit: 2023-09-13 | Discharge: 2023-09-14 | Payer: Medicare (Managed Care)

## 2023-09-13 ENCOUNTER — Ambulatory Visit
Admit: 2023-09-13 | Discharge: 2023-09-14 | Payer: Medicare (Managed Care) | Attending: Internal Medicine | Primary: Internal Medicine

## 2023-09-13 DIAGNOSIS — C689 Malignant neoplasm of urinary organ, unspecified: Principal | ICD-10-CM

## 2023-09-13 DIAGNOSIS — C778 Secondary and unspecified malignant neoplasm of lymph nodes of multiple regions: Principal | ICD-10-CM

## 2023-09-17 DIAGNOSIS — C679 Malignant neoplasm of bladder, unspecified: Principal | ICD-10-CM

## 2023-09-17 DIAGNOSIS — C689 Malignant neoplasm of urinary organ, unspecified: Principal | ICD-10-CM

## 2023-09-17 DIAGNOSIS — C778 Secondary and unspecified malignant neoplasm of lymph nodes of multiple regions: Principal | ICD-10-CM

## 2023-09-21 DIAGNOSIS — C689 Malignant neoplasm of urinary organ, unspecified: Principal | ICD-10-CM

## 2023-09-25 DIAGNOSIS — C689 Malignant neoplasm of urinary organ, unspecified: Principal | ICD-10-CM

## 2023-10-11 ENCOUNTER — Inpatient Hospital Stay: Admit: 2023-10-11 | Discharge: 2023-10-12 | Payer: Medicare (Managed Care)

## 2023-10-11 ENCOUNTER — Inpatient Hospital Stay: Admit: 2023-10-11 | Discharge: 2023-10-11 | Payer: Medicare (Managed Care)

## 2023-10-15 ENCOUNTER — Encounter
Admit: 2023-10-15 | Discharge: 2023-10-16 | Payer: Medicare (Managed Care) | Attending: Internal Medicine | Primary: Internal Medicine

## 2023-10-15 DIAGNOSIS — C689 Malignant neoplasm of urinary organ, unspecified: Principal | ICD-10-CM

## 2023-10-15 DIAGNOSIS — C679 Malignant neoplasm of bladder, unspecified: Principal | ICD-10-CM

## 2023-10-15 DIAGNOSIS — C778 Secondary and unspecified malignant neoplasm of lymph nodes of multiple regions: Principal | ICD-10-CM

## 2024-03-03 DIAGNOSIS — L439 Lichen planus, unspecified: Principal | ICD-10-CM

## 2024-03-03 DIAGNOSIS — L820 Inflamed seborrheic keratosis: Principal | ICD-10-CM

## 2024-03-03 DIAGNOSIS — L659 Nonscarring hair loss, unspecified: Principal | ICD-10-CM

## 2024-03-03 MED ORDER — CLOBETASOL 0.05 % SCALP SOLUTION
OPHTHALMIC | 5 refills | 0.00000 days | Status: CP
Start: 2024-03-03 — End: ?

## 2024-03-04 DIAGNOSIS — C689 Malignant neoplasm of urinary organ, unspecified: Principal | ICD-10-CM

## 2024-03-05 DIAGNOSIS — C689 Malignant neoplasm of urinary organ, unspecified: Principal | ICD-10-CM

## 2024-03-07 DIAGNOSIS — C689 Malignant neoplasm of urinary organ, unspecified: Principal | ICD-10-CM

## 2024-04-10 ENCOUNTER — Inpatient Hospital Stay: Admit: 2024-04-10 | Discharge: 2024-04-11 | Payer: Medicare (Managed Care)

## 2024-04-10 ENCOUNTER — Ambulatory Visit: Admit: 2024-04-10 | Payer: Medicare (Managed Care)

## 2024-04-10 DIAGNOSIS — C689 Malignant neoplasm of urinary organ, unspecified: Principal | ICD-10-CM

## 2024-04-14 ENCOUNTER — Ambulatory Visit
Admit: 2024-04-14 | Discharge: 2024-04-15 | Payer: Medicare (Managed Care) | Attending: Internal Medicine | Primary: Internal Medicine

## 2024-04-14 ENCOUNTER — Ambulatory Visit: Admit: 2024-04-14 | Discharge: 2024-04-15 | Payer: Medicare (Managed Care)

## 2024-04-14 DIAGNOSIS — C689 Malignant neoplasm of urinary organ, unspecified: Principal | ICD-10-CM

## 2024-04-18 DIAGNOSIS — N824 Other female intestinal-genital tract fistulae: Principal | ICD-10-CM

## 2024-04-18 DIAGNOSIS — C689 Malignant neoplasm of urinary organ, unspecified: Principal | ICD-10-CM

## 2024-04-22 DIAGNOSIS — C689 Malignant neoplasm of urinary organ, unspecified: Principal | ICD-10-CM

## 2024-04-22 DIAGNOSIS — N824 Other female intestinal-genital tract fistulae: Principal | ICD-10-CM

## 2024-04-23 DIAGNOSIS — C689 Malignant neoplasm of urinary organ, unspecified: Principal | ICD-10-CM
